# Patient Record
Sex: Female | Born: 1960
Health system: Southern US, Community
[De-identification: ages and names within clinical notes are randomized; demographics above are authoritative.]

## PROBLEM LIST (undated history)

## (undated) DIAGNOSIS — E890 Postprocedural hypothyroidism: Secondary | ICD-10-CM

## (undated) DIAGNOSIS — C801 Malignant (primary) neoplasm, unspecified: Secondary | ICD-10-CM

## (undated) DIAGNOSIS — E0500 Thyrotoxicosis with diffuse goiter without thyrotoxic crisis or storm: Secondary | ICD-10-CM

## (undated) DIAGNOSIS — Z923 Personal history of irradiation: Secondary | ICD-10-CM

## (undated) DIAGNOSIS — F988 Other specified behavioral and emotional disorders with onset usually occurring in childhood and adolescence: Secondary | ICD-10-CM

## (undated) DIAGNOSIS — M8580 Other specified disorders of bone density and structure, unspecified site: Secondary | ICD-10-CM

## (undated) HISTORY — PX: BREAST LUMPECTOMY: SHX2

## (undated) HISTORY — PX: APPENDECTOMY: SHX54

## (undated) HISTORY — PX: OTHER SURGICAL HISTORY: SHX169

## (undated) HISTORY — PX: ORIF TIBIA PLATEAU: SHX2132

## (undated) HISTORY — DX: Other specified behavioral and emotional disorders with onset usually occurring in childhood and adolescence: F98.8

## (undated) HISTORY — DX: Thyrotoxicosis with diffuse goiter without thyrotoxic crisis or storm: E05.00

## (undated) HISTORY — PX: MINOR IRRIGATION AND DEBRIDEMENT OF WOUND: SHX6239

## (undated) HISTORY — DX: Postprocedural hypothyroidism: E89.0

## (undated) HISTORY — PX: VAGINAL HYSTERECTOMY: SUR661

---
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
## 1998-11-03 ENCOUNTER — Other Ambulatory Visit: Admission: RE | Admit: 1998-11-03 | Discharge: 1998-11-03 | Payer: Self-pay | Admitting: *Deleted

## 2001-06-29 ENCOUNTER — Encounter: Admission: RE | Admit: 2001-06-29 | Discharge: 2001-06-29 | Payer: Self-pay | Admitting: Internal Medicine

## 2001-06-29 ENCOUNTER — Encounter: Payer: Self-pay | Admitting: Internal Medicine

## 2002-01-02 ENCOUNTER — Other Ambulatory Visit: Admission: RE | Admit: 2002-01-02 | Discharge: 2002-01-02 | Payer: Self-pay | Admitting: *Deleted

## 2002-02-07 ENCOUNTER — Other Ambulatory Visit: Admission: RE | Admit: 2002-02-07 | Discharge: 2002-02-07 | Payer: Self-pay | Admitting: *Deleted

## 2002-09-04 ENCOUNTER — Other Ambulatory Visit: Admission: RE | Admit: 2002-09-04 | Discharge: 2002-09-04 | Payer: Self-pay | Admitting: *Deleted

## 2003-08-21 ENCOUNTER — Other Ambulatory Visit: Admission: RE | Admit: 2003-08-21 | Discharge: 2003-08-21 | Payer: Self-pay | Admitting: *Deleted

## 2003-10-20 ENCOUNTER — Emergency Department (HOSPITAL_COMMUNITY): Admission: EM | Admit: 2003-10-20 | Discharge: 2003-10-21 | Payer: Self-pay | Admitting: Emergency Medicine

## 2004-08-03 ENCOUNTER — Other Ambulatory Visit: Admission: RE | Admit: 2004-08-03 | Discharge: 2004-08-03 | Payer: Self-pay | Admitting: *Deleted

## 2005-04-21 ENCOUNTER — Encounter: Admission: RE | Admit: 2005-04-21 | Discharge: 2005-04-21 | Payer: Self-pay | Admitting: Internal Medicine

## 2005-06-11 ENCOUNTER — Encounter: Admission: RE | Admit: 2005-06-11 | Discharge: 2005-06-11 | Payer: Self-pay | Admitting: Internal Medicine

## 2005-10-11 ENCOUNTER — Encounter: Payer: Self-pay | Admitting: *Deleted

## 2005-10-14 ENCOUNTER — Encounter (INDEPENDENT_AMBULATORY_CARE_PROVIDER_SITE_OTHER): Payer: Self-pay | Admitting: Specialist

## 2005-10-14 ENCOUNTER — Inpatient Hospital Stay (HOSPITAL_COMMUNITY): Admission: AD | Admit: 2005-10-14 | Discharge: 2005-10-16 | Payer: Self-pay | Admitting: *Deleted

## 2007-03-09 ENCOUNTER — Encounter: Admission: RE | Admit: 2007-03-09 | Discharge: 2007-03-09 | Payer: Self-pay | Admitting: Family Medicine

## 2007-10-19 ENCOUNTER — Encounter: Admission: RE | Admit: 2007-10-19 | Discharge: 2007-10-19 | Payer: Self-pay | Admitting: Family Medicine

## 2009-08-29 ENCOUNTER — Encounter: Admission: RE | Admit: 2009-08-29 | Discharge: 2009-08-29 | Payer: Self-pay | Admitting: Orthopedic Surgery

## 2009-09-16 ENCOUNTER — Encounter: Admission: RE | Admit: 2009-09-16 | Discharge: 2009-09-16 | Payer: Self-pay | Admitting: Orthopedic Surgery

## 2010-05-07 ENCOUNTER — Encounter: Admission: RE | Admit: 2010-05-07 | Discharge: 2010-05-07 | Payer: Self-pay | Admitting: Family Medicine

## 2010-12-27 ENCOUNTER — Encounter: Payer: Self-pay | Admitting: Orthopedic Surgery

## 2011-01-07 ENCOUNTER — Other Ambulatory Visit: Payer: Self-pay | Admitting: Orthopedic Surgery

## 2011-01-07 ENCOUNTER — Ambulatory Visit
Admission: RE | Admit: 2011-01-07 | Discharge: 2011-01-07 | Disposition: A | Discharge status: Home / Self Care | Payer: PRIVATE HEALTH INSURANCE | Source: Ambulatory Visit | Attending: Orthopedic Surgery | Admitting: Orthopedic Surgery

## 2011-03-23 ENCOUNTER — Other Ambulatory Visit: Payer: Self-pay | Admitting: Family Medicine

## 2011-03-23 ENCOUNTER — Other Ambulatory Visit (HOSPITAL_COMMUNITY)
Admission: RE | Admit: 2011-03-23 | Discharge: 2011-03-23 | Disposition: A | Discharge status: Home / Self Care | Payer: PRIVATE HEALTH INSURANCE | Source: Ambulatory Visit | Attending: Family Medicine | Admitting: Family Medicine

## 2011-03-23 DIAGNOSIS — Z124 Encounter for screening for malignant neoplasm of cervix: Secondary | ICD-10-CM | POA: Insufficient documentation

## 2011-04-23 NOTE — H&P (Signed)
[name], [name] NO.:  [id number]   MEDICAL RECORD NO.:  [id number]          PATIENT TYPE:   LOCATION:                                 FACILITY:   PHYSICIAN:  Carla Balls. [name], M.D.        DATE OF BIRTH:   DATE OF ADMISSION:  10/14/2005  DATE OF DISCHARGE:                                HISTORY & PHYSICAL   CHIEF COMPLAINT:  Pain, question endometriosis.   HISTORY:  The patient is a 50 year old gravida 4, para 4 whose last  menstrual period was 01 October 2005. She has been followed in our office  for the past 7 years and has had gradually increasing problem with menses in  terms of pain and heavy flow. She was recently evaluated with abdominal and  pelvic sonogram revealing abnormal uterine size and possible endometrioma on  the left ovary. Because of the persistent pain and abnormal-appearing  sonogram, she is admitted at this time for TAH, possible BSO. She has been  fully counseled as to the nature of the procedure and the risks involved to  include risks of anesthesia, injury to bowel, bladder, blood vessels,  ureters, postoperative hemorrhage, infection, recuperation, use of hormone  replacement should her ovaries be removed. The patient has a history of  elevated lipoprotein A and LDL and is concerned regarding hormone  replacement and the risk of stroke following surgery. She has been counseled  fully on this and will await the surgery to see if this is necessary. She  underwent endometrial biopsy in August 2006 which was totally benign.   PAST MEDICAL HISTORY:  1.  Appendectomy in 1980.  2.  I&D of a pelvic fracture and 1982.  3.  She is hypothyroid and maintained on Synthroid 0.075 mg daily.   FAMILY HISTORY:  Mother with hypertension. Grandfather with an MI.  Grandmother had died of brain tumor. Maternal aunts had breast cancer.   ALLERGIES:  The patient is allergic to no medication.   SOCIAL HISTORY:  The patient is a nonsmoker.   REVIEW  OF SYSTEMS:  HEENT: Negative. CARDIORESPIRATORY: Negative.  GASTROINTESTINAL: Negative. GENITOURINARY: As in present illness.  NEUROMUSCULAR: Negative.   PHYSICAL EXAMINATION:  VITAL SIGNS: Height of 5 feet 7 inches, weight 129  pounds, blood pressure 118/70, pulse 84, respirations 18.  GENERAL: Well-developed white female in no acute distress.  HEENT: Within normal limits.  NECK:  Supple without masses, adenopathy or bruits.  HEART:  Regular rate and rhythm without murmurs.  LUNGS:  Clear to percussion and auscultation.  BREASTS: Examined sitting and lying without mass. Axilla negative.  ABDOMEN: Flat and soft without mass, slightly tender in the both lower  quadrants, left greater than right.  PELVIC:  External genitalia, Bartholin's urethra, and Skene's glands within  normal limits. Vagina is clean. Cervix slightly inflamed. Uterus mid  posterior, top-normal size, shape and contour. Full and tender to the left  with right somewhat tender as well. Anterior posterior cul-de-sac exam is  confirmatory.  EXTREMITIES: Within normal limits.  CENTRAL NERVOUS SYSTEM: Grossly intact.  SKIN:  Without suspicious lesions.  IMPRESSION:  Pelvic pain, persisting left ovarian mass, probable  endometrioma.   DISPOSITION:  As noted above, Pap smear has been normal within the past year  size.           ______________________________  Carla Balls. [name] Patient, M.D.     SRF/MEDQ  D:  10/06/2005  T:  10/06/2005  Job:  [id number]

## 2011-04-23 NOTE — Op Note (Signed)
[name], [name]            ACCOUNT NO.:  [id number]   MEDICAL RECORD NO.:  [id number]          PATIENT TYPE:  AMB   LOCATION:  DAY                          FACILITY:  [facility]   PHYSICIAN:  Almedia Balls. Fore, M.D.   DATE OF BIRTH:  29-Nov-1961   DATE OF PROCEDURE:  10/14/2005  DATE OF DISCHARGE:                                 OPERATIVE REPORT   PREOPERATIVE DIAGNOSES:  1.  Pelvic pain.  2.  Left ovarian cyst.  3.  Abnormal uterine bleeding.  4.  Uterine enlargement.   POSTOPERATIVE DIAGNOSES:  1.  Pelvic pain.  2.  Left ovarian cyst.  3.  Abnormal uterine bleeding.  4.  Uterine enlargement.  5.  Pending pathology.  6.  Frozen section diagnosis of hemorrhagic left ovarian cyst.   OPERATION:  Total abdominal hysterectomy, left salpingo-oophorectomy, right  salpingectomy.   ANESTHESIA:  General orotracheal.   OPERATOR:  Almedia Balls. Randell Patient, M.D.   FIRST ASSISTANT:  Leona Singleton, M.D.   INDICATIONS FOR SURGERY:  The patient is a 50 year old with the above-noted  problems who was counseled as to the need for surgery to treat these  problems. She was fully counseled as to the nature the procedure and the  risks involved including risks of anesthesia, injury to bowel, bladder,  blood vessels, ureters, postoperative hemorrhage, infection, recuperation,  possible hormone replacement should her ovaries be removed. She fully  understands all these considerations and wishes to proceed on October 14, 2005.   OPERATIVE FINDINGS:  On entry into the abdomen, the left ovary was involved  with a cystic mass approximately 8-9 cm in greatest dimension. The cyst was  inadvertently ruptured during the procedure with release of a chocolate  material and old blood. There were adhesions involving the left ovary to the  posterolateral peritoneal surface and the descending rectosigmoid. Right  tube and ovary appeared normal. Uterus was enlarged and soft. Upper  abdominal viscera were normal to  palpation to include liver, gallbladder  areas, spleen, diaphragms, kidneys, periaortic areas.   PROCEDURE:  With the patient under general anesthesia, prepared and draped  in the usual sterile fashion, with a Foley catheter in the bladder, lower  abdominal transverse incision was made and carried into the peritoneal  cavity without difficulty. Self-retaining retractor was placed, and the left  ovary was elevated out of its bed with rupture as noted above. Adhesions  were lysed using Bovie electrocoagulation. The infundibulopelvic ligament on  the left was identified well above the course of the ureter, clamped, cut,  and doubly ligated with 1-0 chromic catgut. The ovarian attachments were  lysed using Bovie electrocoagulation, and Kelly clamp had been placed at the  utero-ovarian anastomosis to the round ligament bilaterally for hemostasis  and traction. The left tube and ovary were then excised and sent for frozen  section with the above-noted diagnosis. The round ligaments were then  transected bilaterally with development of the bladder flap anteriorly using  Bovie electrocoagulation. The bladder was advanced over the lower uterine  segment and cervix without difficulty. The right ovary was normal in  appearance, so a  Heaney clamp was placed proximal to the ovary for  conservation of the structure with the utero-ovarian ligament being cut and  doubly tied with 1-0 chromic catgut. Uterine vessels bilaterally were then  skeletonized, clamped, cut, and suture ligated with 1-0 chromic catgut.  Cardinal ligaments bilaterally were clamped using Heaney clamps, cut, and  suture ligated with 1-0 chromic catgut. It was impossible to excise the  uterus and the majority of the cervix by using Bovie electrocoagulation to  develop the planes and remove the uterus intact. A portion of cervical rim  was left. The cervix and vaginal tissues were reapproximated and rendered  hemostatic with interrupted  figure-of-eight sutures of 1-0 chromic catgut.  The areas was lavaged with copious amounts of lactated Ringer solution, and  after noting hemostasis was maintained in the surgical site, the area was  reperitonealized with a continuous suture of 2-0 PDS. The right tube was  excised using Bovie electrocoagulation to render these the salpinx  hemostatic and excised in this manner. Small bleeders were rendered  hemostatic with Bovie electrocoagulation. Right ovary was elevated out of  the pelvis with an interrupted suture of 2-0 PDS. This was attached to the  right round ligament. At this point with good hemostasis, the correct sponge  and instrument count, the peritoneum was closed with a continuous suture of  0 Vicryl. Fascia was closed with two sutures of 0 Vicryl which were brought  from the lateral aspects of the incision and tied separately in the midline.  Subcutaneous fat was reapproximated with interrupted sutures of 0 Vicryl.  Skin was closed with subcuticular suture of 3-0 plain catgut. Estimated  blood loss 500 mL. The patient was taken to recovery room in good condition  with slightly blood-tinged urine in the Foley catheter tubing. She will be  placed on 23-hour observation following surgery.           ______________________________  [name] Patient, M.D.     SRF/MEDQ  D:  10/14/2005  T:  10/14/2005  Job:  [id number]   cc:   Leona Singleton, M.D.  Fax: [phone]

## 2011-04-23 NOTE — Discharge Summary (Signed)
[name], [name] NO.:  [id number]   MEDICAL RECORD NO.:  [id number]          PATIENT TYPE:  INP   LOCATION:  9302                          FACILITY:  [facility]   PHYSICIAN:  Almedia Balls. Fore, M.D.   DATE OF BIRTH:  [birth year]   DATE OF ADMISSION:  10/14/2005  DATE OF DISCHARGE:  10/16/2005                                 DISCHARGE SUMMARY   HISTORY:  The patient is a 50 year old with pelvic pain, persisting left  ovarian cyst, abnormal uterine bleeding,  uterine enlargement for  hysterectomy on October 14, 2005.  The remainder of her history and physical  are as previously dictated.   LABORATORY DATA:  Preoperative hemoglobin 12.5.  Chest x-ray showed old  granulomatous disease at the apices without acute disease being present.  Electrocardiogram showed possible left atrial enlargement; otherwise normal.   HOSPITAL COURSE:  The patient was taken to the operating room on November 9,  at which time abdominal hysterectomy, left salpingo-oophorectomy, right  salpingectomy were performed for what was found on frozen section to be a  benign hemorrhagic left ovarian cyst and adhesions.  The patient did well  postoperatively except for some postoperative nausea on day #1  postoperatively.  She was kept in the hospital over the evening on November  10 and on the morning of November 11, she was afebrile and experiencing no  problems.  Diet and ambulation had been progressed over the afternoon and  evening of November 10 and the early morning of November 11. It was felt  that she could be discharged on November 11.   FINAL DIAGNOSES:  1.  Pelvic pain.  2.  Large left ovarian cyst.  3.  Abnormal uterine bleeding.  4.  Uterine enlargement.   OPERATION/PROCEDURE:  1.  Abdominal hysteroscopy.  2.  Left salpingo-oophorectomy.  3.  Right salpingectomy.   PATHOLOGY:  Report unavailable at the time of dictation.   DISPOSITION:  Discharged home to return to the office in two  weeks for  followup.   ACTIVITY:  She was instructed to gradually progress with her activities over  several weeks at home and limit lifting and driving for two weeks. She was  fully ambulatory.   DIET:  Regular.   CONDITION ON DISCHARGE:  In good condition at the time of discharge.   DISCHARGE MEDICATIONS:  She has been given prescription for:  1.  Percocet 10/325 mg, generic #30 to be taken one to two q.6h. p.r.n.      pain.  2.  Doxycycline 100 mg #12 to be taken one twice a day.   DISCHARGE INSTRUCTIONS:  She will call for any problems.           ______________________________  [name] Patient, M.D.     SRF/MEDQ  D:  10/16/2005  T:  10/16/2005  Job:  [id number]

## 2014-06-06 ENCOUNTER — Other Ambulatory Visit: Payer: Self-pay | Admitting: Orthopedic Surgery

## 2014-06-06 DIAGNOSIS — S83282A Other tear of lateral meniscus, current injury, left knee, initial encounter: Secondary | ICD-10-CM

## 2014-06-06 DIAGNOSIS — M25562 Pain in left knee: Secondary | ICD-10-CM

## 2014-06-09 ENCOUNTER — Ambulatory Visit
Admission: RE | Admit: 2014-06-09 | Discharge: 2014-06-09 | Disposition: A | Discharge status: Home / Self Care | Payer: PRIVATE HEALTH INSURANCE | Source: Ambulatory Visit | Attending: Orthopedic Surgery | Admitting: Orthopedic Surgery

## 2014-06-09 DIAGNOSIS — M25562 Pain in left knee: Secondary | ICD-10-CM

## 2014-06-09 DIAGNOSIS — S83282A Other tear of lateral meniscus, current injury, left knee, initial encounter: Secondary | ICD-10-CM

## 2014-06-12 ENCOUNTER — Other Ambulatory Visit: Payer: PRIVATE HEALTH INSURANCE

## 2014-06-18 ENCOUNTER — Other Ambulatory Visit: Payer: Self-pay | Admitting: Orthopedic Surgery

## 2014-06-18 DIAGNOSIS — S83282A Other tear of lateral meniscus, current injury, left knee, initial encounter: Secondary | ICD-10-CM

## 2014-06-28 ENCOUNTER — Ambulatory Visit
Admission: RE | Admit: 2014-06-28 | Discharge: 2014-06-28 | Disposition: A | Discharge status: Home / Self Care | Payer: PRIVATE HEALTH INSURANCE | Source: Ambulatory Visit | Attending: Orthopedic Surgery | Admitting: Orthopedic Surgery

## 2014-06-28 DIAGNOSIS — S83282A Other tear of lateral meniscus, current injury, left knee, initial encounter: Secondary | ICD-10-CM

## 2014-06-28 MED ORDER — IOHEXOL 180 MG/ML  SOLN
30.0000 mL | Freq: Once | INTRAMUSCULAR | Status: AC | PRN
Start: 2014-06-28 — End: 2014-06-28
  Administered 2014-06-28: 30 mL via INTRA_ARTICULAR

## 2014-09-13 ENCOUNTER — Other Ambulatory Visit: Payer: Self-pay

## 2014-09-13 DIAGNOSIS — Z1231 Encounter for screening mammogram for malignant neoplasm of breast: Secondary | ICD-10-CM

## 2014-09-26 ENCOUNTER — Encounter (INDEPENDENT_AMBULATORY_CARE_PROVIDER_SITE_OTHER): Payer: Self-pay

## 2014-09-26 ENCOUNTER — Ambulatory Visit
Admission: RE | Admit: 2014-09-26 | Discharge: 2014-09-26 | Disposition: A | Discharge status: Home / Self Care | Payer: PRIVATE HEALTH INSURANCE | Source: Ambulatory Visit

## 2014-09-26 DIAGNOSIS — Z1231 Encounter for screening mammogram for malignant neoplasm of breast: Secondary | ICD-10-CM

## 2014-10-28 ENCOUNTER — Other Ambulatory Visit: Payer: Self-pay | Admitting: Family Medicine

## 2014-10-28 DIAGNOSIS — Z87891 Personal history of nicotine dependence: Secondary | ICD-10-CM

## 2014-11-07 ENCOUNTER — Ambulatory Visit
Admission: RE | Admit: 2014-11-07 | Discharge: 2014-11-07 | Disposition: A | Discharge status: Home / Self Care | Payer: No Typology Code available for payment source | Source: Ambulatory Visit | Attending: Family Medicine | Admitting: Family Medicine

## 2014-11-07 DIAGNOSIS — Z87891 Personal history of nicotine dependence: Secondary | ICD-10-CM

## 2014-11-18 ENCOUNTER — Telehealth: Payer: Self-pay | Admitting: Cardiovascular Disease

## 2014-11-18 NOTE — Telephone Encounter (Signed)
New Message   Pt's husband- Dr. Candise Che [facility]) - wanted pt to be seen by Dr. Johnsie Cancel ASAP. Please call back and discuss.

## 2014-11-18 NOTE — Telephone Encounter (Signed)
Spoke with patient's husband, who reports she had a CT chest done. This showed some coronary artery calcification. He would like her to see Dr. Johnsie Cancel for this. Dr. Lady Deutscher PCP recommended. She would be a new patient. Will forward to .schedulers

## 2014-11-26 ENCOUNTER — Telehealth: Payer: Self-pay | Admitting: *Deleted

## 2014-11-26 NOTE — Telephone Encounter (Signed)
PT'S  HUSBAND  CALLED  AND  WOULD LIKE  WIFE  TO  SEE   DR  Johnsie Cancel RE   CT  RESULTS   WAS  WANTING  TO BE  SEEN  12-02-14 WILL FORWARD  TO  DR Johnsie Cancel  TO FIND OUT WHEN IT WOULD YOU  COULD  SEE PT .Adonis Housekeeper

## 2014-11-28 ENCOUNTER — Other Ambulatory Visit: Payer: Self-pay | Admitting: *Deleted

## 2014-11-28 DIAGNOSIS — I2510 Atherosclerotic heart disease of native coronary artery without angina pectoris: Secondary | ICD-10-CM

## 2014-12-02 NOTE — Telephone Encounter (Signed)
F/u   Pt stated Dr Johnsie Cancel called her and stated she needed an EKG b/f her appt with him on 12/04/14. There wasn't any notes in the system. I advise pt she was sched for an EKG on 12/04/14 and pt wanted me to check with nurse to make sure that appt for EKG was all right. Please advise pt.

## 2014-12-02 NOTE — Telephone Encounter (Signed)
Instructed patient to come to her appointment 12/20 with Dr. Johnsie Cancel at least 30 minutes in advance to have her EKG completed. Patient appreciative and agrees with treatment plan.

## 2014-12-04 ENCOUNTER — Ambulatory Visit (INDEPENDENT_AMBULATORY_CARE_PROVIDER_SITE_OTHER): Payer: PRIVATE HEALTH INSURANCE | Admitting: Cardiovascular Disease

## 2014-12-04 ENCOUNTER — Ambulatory Visit: Payer: PRIVATE HEALTH INSURANCE | Admitting: Cardiovascular Disease

## 2014-12-04 DIAGNOSIS — I2510 Atherosclerotic heart disease of native coronary artery without angina pectoris: Secondary | ICD-10-CM

## 2014-12-04 NOTE — Progress Notes (Signed)
Exercise Treadmill Test  Pre-Exercise Testing Evaluation Rhythm: sinus tachycardia  Rate: 104 bpm     Test  Exercise Tolerance Test Ordering MD: Jenkins Rouge, MD  Interpreting MD: Jenkins Rouge, MD  Unique Test No: 1  Treadmill:  1  Indication for ETT: CAD  Contraindication to ETT: No   Stress Modality: exercise - treadmill  Cardiac Imaging Performed: non   Protocol: standard Bruce - maximal  Max BP:  152/77  Max MPHR (bpm):  165 85% MPR (bpm):  140  MPHR obtained (bpm):  171 % MPHR obtained:  103  Reached 85% MPHR (min:sec):  4:15 Total Exercise Time (min-sec):  8:32  Workload in METS:  14.3 Borg Scale: 18  Reason ETT Terminated:  fatigue    ST Segment Analysis At Rest: normal ST segments - no evidence of significant ST depression With Exercise: no evidence of significant ST depression  Other Information Arrhythmia:  No Angina during ETT:  absent (0) Quality of ETT:  diagnostic  ETT Interpretation:  normal - no evidence of ischemia by ST analysis  Comments: Isolated PVC in recovery no chest pain normal hemodynamic response no ischemia  Recommendations: F/U Primary

## 2016-12-06 DIAGNOSIS — C50919 Malignant neoplasm of unspecified site of unspecified female breast: Secondary | ICD-10-CM

## 2016-12-06 DIAGNOSIS — Z923 Personal history of irradiation: Secondary | ICD-10-CM

## 2016-12-06 HISTORY — DX: Personal history of irradiation: Z92.3

## 2016-12-06 HISTORY — DX: Malignant neoplasm of unspecified site of unspecified female breast: C50.919

## 2017-07-19 ENCOUNTER — Other Ambulatory Visit: Payer: Self-pay | Admitting: Endocrinology

## 2017-07-19 DIAGNOSIS — M810 Age-related osteoporosis without current pathological fracture: Secondary | ICD-10-CM

## 2017-07-19 DIAGNOSIS — Z1231 Encounter for screening mammogram for malignant neoplasm of breast: Secondary | ICD-10-CM

## 2017-08-02 ENCOUNTER — Ambulatory Visit
Admission: RE | Admit: 2017-08-02 | Discharge: 2017-08-02 | Disposition: A | Discharge status: Home / Self Care | Payer: PRIVATE HEALTH INSURANCE | Source: Ambulatory Visit | Attending: Endocrinology | Admitting: Endocrinology

## 2017-08-02 DIAGNOSIS — Z1231 Encounter for screening mammogram for malignant neoplasm of breast: Secondary | ICD-10-CM

## 2017-08-02 DIAGNOSIS — M810 Age-related osteoporosis without current pathological fracture: Secondary | ICD-10-CM

## 2017-08-03 ENCOUNTER — Other Ambulatory Visit: Payer: Self-pay | Admitting: Endocrinology

## 2017-08-03 DIAGNOSIS — R928 Other abnormal and inconclusive findings on diagnostic imaging of breast: Secondary | ICD-10-CM

## 2017-08-04 ENCOUNTER — Ambulatory Visit: Admission: RE | Admit: 2017-08-04 | Payer: PRIVATE HEALTH INSURANCE | Source: Ambulatory Visit

## 2017-08-04 ENCOUNTER — Ambulatory Visit
Admission: RE | Admit: 2017-08-04 | Discharge: 2017-08-04 | Disposition: A | Discharge status: Home / Self Care | Payer: PRIVATE HEALTH INSURANCE | Source: Ambulatory Visit | Attending: Endocrinology | Admitting: Endocrinology

## 2017-08-04 ENCOUNTER — Other Ambulatory Visit: Payer: Self-pay | Admitting: Endocrinology

## 2017-08-04 DIAGNOSIS — R921 Mammographic calcification found on diagnostic imaging of breast: Secondary | ICD-10-CM

## 2017-08-04 DIAGNOSIS — R928 Other abnormal and inconclusive findings on diagnostic imaging of breast: Secondary | ICD-10-CM

## 2017-08-09 ENCOUNTER — Other Ambulatory Visit: Payer: Self-pay | Admitting: *Deleted

## 2017-08-09 ENCOUNTER — Telehealth: Payer: Self-pay | Admitting: Hematology and Oncology

## 2017-08-09 DIAGNOSIS — D0512 Intraductal carcinoma in situ of left breast: Secondary | ICD-10-CM | POA: Insufficient documentation

## 2017-08-09 NOTE — Telephone Encounter (Signed)
LVM for patient to confirm upcoming appointment for 08/10/17, sent email with intake form to [email or web address]  08/09/17

## 2017-08-10 ENCOUNTER — Encounter: Payer: Self-pay | Admitting: Hematology and Oncology

## 2017-08-10 ENCOUNTER — Ambulatory Visit: Payer: PRIVATE HEALTH INSURANCE | Admitting: Physical Therapy

## 2017-08-10 ENCOUNTER — Other Ambulatory Visit: Payer: Self-pay | Admitting: General Surgery

## 2017-08-10 ENCOUNTER — Ambulatory Visit (HOSPITAL_BASED_OUTPATIENT_CLINIC_OR_DEPARTMENT_OTHER): Payer: PRIVATE HEALTH INSURANCE | Admitting: Hematology and Oncology

## 2017-08-10 ENCOUNTER — Inpatient Hospital Stay
Admission: RE | Admit: 2017-08-10 | Discharge: 2017-08-10 | Disposition: A | Discharge status: Home / Self Care | Payer: PRIVATE HEALTH INSURANCE | Source: Ambulatory Visit | Attending: Radiation Oncology | Admitting: Radiation Oncology

## 2017-08-10 ENCOUNTER — Telehealth: Payer: Self-pay

## 2017-08-10 ENCOUNTER — Encounter: Payer: Self-pay | Admitting: General Practice

## 2017-08-10 ENCOUNTER — Other Ambulatory Visit (HOSPITAL_BASED_OUTPATIENT_CLINIC_OR_DEPARTMENT_OTHER): Payer: PRIVATE HEALTH INSURANCE

## 2017-08-10 DIAGNOSIS — D0512 Intraductal carcinoma in situ of left breast: Secondary | ICD-10-CM

## 2017-08-10 DIAGNOSIS — Z170 Estrogen receptor positive status [ER+]: Secondary | ICD-10-CM

## 2017-08-10 LAB — COMPREHENSIVE METABOLIC PANEL
ALT: 14 U/L (ref 0–55)
AST: 21 U/L (ref 5–34)
Albumin: 4.1 g/dL (ref 3.5–5.0)
Alkaline Phosphatase: 43 U/L (ref 40–150)
Anion Gap: 10 mEq/L (ref 3–11)
BUN: 6.4 mg/dL — ABNORMAL LOW (ref 7.0–26.0)
CO2: 25 mEq/L (ref 22–29)
Calcium: 10.2 mg/dL (ref 8.4–10.4)
Chloride: 101 mEq/L (ref 98–109)
Creatinine: 0.7 mg/dL (ref 0.6–1.1)
EGFR: >90 mL/min/{1.73_m2} (ref >90)
Glucose: 87 mg/dl (ref 70–140)
Potassium: 4.3 mEq/L (ref 3.5–5.1)
Sodium: 136 mEq/L (ref 136–145)
Total Bilirubin: 0.43 mg/dL (ref 0.20–1.20)
Total Protein: 7.3 g/dL (ref 6.4–8.3)

## 2017-08-10 LAB — CBC WITH DIFFERENTIAL/PLATELET
BASO%: 1.2 % (ref 0.0–2.0)
Basophils Absolute: 0.1 10*3/uL (ref 0.0–0.1)
EOS%: 0.6 % (ref 0.0–7.0)
Eosinophils Absolute: 0 10*3/uL (ref 0.0–0.5)
HCT: 37 % (ref 34.8–46.6)
HGB: 12.6 g/dL (ref 11.6–15.9)
LYMPH%: 43.2 % (ref 14.0–49.7)
MCH: 32.7 pg (ref 25.1–34.0)
MCHC: 34 g/dL (ref 31.5–36.0)
MCV: 96.4 fL (ref 79.5–101.0)
MONO#: 0.5 10*3/uL (ref 0.1–0.9)
MONO%: 11.3 % (ref 0.0–14.0)
NEUT#: 2.1 10*3/uL (ref 1.5–6.5)
NEUT%: 43.7 % (ref 38.4–76.8)
Platelets: 285 10*3/uL (ref 145–400)
RBC: 3.84 10*6/uL (ref 3.70–5.45)
RDW: 12.7 % (ref 11.2–14.5)
WBC: 4.7 10*3/uL (ref 3.9–10.3)
lymph#: 2 10*3/uL (ref 0.9–3.3)

## 2017-08-10 NOTE — Progress Notes (Signed)
Radiation Oncology         [phone]) [phone] ________________________________  Name: [name]        MRN: [id number]  Date of Service: 08/10/2017 DOB: [birth year]   REFERRING PHYSICIAN: Kelton Pillar, MD   DIAGNOSIS: The encounter diagnosis was Ductal carcinoma in situ (DCIS) of left breast.   HISTORY OF PRESENT ILLNESS: [name] is a 56 y.o. female seen in the multidisciplinary breast clinic. The patient was found to have calcifications at 6:00 in the left breast on screening mammogram. Diagnostic imaging revealed a 1.2 x 1.1 x 1.7 cm mass that was biopsied stereotactically on 08/04/17 which revealed intermediate, ER/PR positive DCIS with calcifications and lobular neoplasia. She comes today to discuss options of treatment for her breast cancer.  PREVIOUS RADIATION THERAPY: Yes   Radioactive Iodine Ablation of the Thyroid in the late 1990s for hyperthyroidism   PAST MEDICAL HISTORY:  Past Medical History:  Diagnosis Date  . ADD (attention deficit disorder)    Adult  . Graves disease   . Postablative hypothyroidism    after radioactive iodine therapy       PAST SURGICAL HISTORY: Past Surgical History:  Procedure Laterality Date  . APPENDECTOMY    . increase lipoprotein    . osteoprosis     s/p fracture L tibial plateau  . VAGINAL HYSTERECTOMY       FAMILY HISTORY:  Family History  Problem Relation Age of Onset  . Breast cancer Maternal Aunt   . Brain cancer Maternal Grandmother      SOCIAL HISTORY:  reports that she has quit smoking. She has never used smokeless tobacco. She reports that she drinks alcohol. She reports that she does not use drugs. The patient is married and lives in [place]. She is a former Chartered loss adjuster. Her husband is a Water engineer.   ALLERGIES: Patient has no known allergies.   MEDICATIONS:  Current Outpatient Prescriptions  Medication Sig Dispense Refill  . Cholecalciferol (VITAMIN D3) 1000 units CAPS Take  1 capsule by mouth daily.    . Ibandronate Sodium (BONIVA PO) Take 1 tablet by mouth every 30 (thirty) days.    Marland Kitchen levothyroxine (SYNTHROID, LEVOTHROID) 75 MCG tablet Take 75 mcg by mouth daily before breakfast.     No current facility-administered medications for this encounter.      REVIEW OF SYSTEMS: On review of systems, the patient reports that she is doing well overall. She denies any chest pain, shortness of breath, cough, fevers, chills, night sweats, unintended weight changes. She denies any bowel or bladder disturbances, and denies abdominal pain, nausea or vomiting. She denies any new musculoskeletal or joint aches or pains. She occasionally notes right ankle edema following an injury about 8 years ago.  A complete review of systems is obtained and is otherwise negative.     PHYSICAL EXAM:  Wt Readings from Last 3 Encounters:  08/10/17 130 lb 3.2 oz (59.1 kg)   Temp Readings from Last 3 Encounters:  08/10/17 98.4 F (36.9 C) (Oral)   BP Readings from Last 3 Encounters:  08/10/17 123/73   Pulse Readings from Last 3 Encounters:  08/10/17 92    In general this is a well appearing caucasian female in no acute distress. She is alert and oriented x4 and appropriate throughout the examination. HEENT reveals that the patient is normocephalic, atraumatic. EOMs are intact. PERRLA. Skin is intact without any evidence of gross lesions. Cardiovascular exam reveals a regular rate and rhythm, no clicks rubs  or murmurs are auscultated. Chest is clear to auscultation bilaterally. Lymphatic assessment is performed and does not reveal any adenopathy in the cervical, supraclavicular, axillary, or inguinal chains. Abdomen has active bowel sounds in all quadrants and is intact. Bilateral breast exam reveals moderate ecchymosis of the left breast inferior to the nipple in the lower outer quadrant. There is induration deep to the biopsy site consistent with hematoma. No other palpable fullness is noted.  No palpable mass is noted of the right breast. Neither breast have any nipple bleeding or discharge noted.  The abdomen is soft, non tender, non distended. Lower extremities reveal R>L trace pretibial pitting edema. No calf tenderness, cyanosis or clubbing is noted.   ECOG = 0  0 - Asymptomatic (Fully active, able to carry on all predisease activities without restriction)  1 - Symptomatic but completely ambulatory (Restricted in physically strenuous activity but ambulatory and able to carry out work of a light or sedentary nature. For example, light housework, office work)  2 - Symptomatic, <50% in bed during the day (Ambulatory and capable of all self care but unable to carry out any work activities. Up and about more than 50% of waking hours)  3 - Symptomatic, >50% in bed, but not bedbound (Capable of only limited self-care, confined to bed or chair 50% or more of waking hours)  4 - Bedbound (Completely disabled. Cannot carry on any self-care. Totally confined to bed or chair)  5 - Death   Eustace Pen MM, Creech RH, Tormey DC, et al. [phone]). "Toxicity and response criteria of the [facility] Group". [place] Oncol. 5 (6): 649-55    LABORATORY DATA:  Lab Results  Component Value Date   WBC 4.7 08/10/2017   HGB 12.6 08/10/2017   HCT 37.0 08/10/2017   MCV 96.4 08/10/2017   PLT 285 08/10/2017   Lab Results  Component Value Date   NA 136 08/10/2017   K 4.3 08/10/2017   CO2 25 08/10/2017   Lab Results  Component Value Date   ALT 14 08/10/2017   AST 21 08/10/2017   ALKPHOS 43 08/10/2017   BILITOT 0.43 08/10/2017      RADIOGRAPHY: Dg Bone Density (dxa)  Result Date: 08/02/2017 EXAM: DUAL X-RAY ABSORPTIOMETRY (DXA) FOR BONE MINERAL DENSITY IMPRESSION: Referring Physician:  Jacelyn Pi PATIENT: Name: [name] A Patient ID: [id number] Birth Date: 11/14/1961 Height: 67.0 in. Sex: Female Measured: 08/02/2017 Weight: 132.7 lbs. Indications: Caucasian,  Estrogen Deficient, Hypothyroid, Hysterectomy, Levothyroxine, Postmenopausal, Right Ovariectomy Fractures: None, Tib/Fib Treatments: Boniva, Vitamin D (E933.5) ASSESSMENT: The BMD measured at Femur Neck Left is 0.724 g/cm2 with a T-score of -2.3. This patient is considered OSTEOPENIC according to [facility]) criteria. L2 was excluded due to being excluded on prior exam. There has been a statistically significant increase in BMD of Lumbar spine, and no statistically significant change in BMD of Left hip since prior exam dated 08/20/2015. Site Region Measured Date Measured Age YA T-score BMD Significant CHANGE DualFemur Neck Left  08/02/2017    58.0         -2.3    0.724 g/cm2 AP Spine L1-L4 (L2) 08/02/2017 56.0 -2.0 0.940 g/cm2 * World Health Organization [facility]) criteria for post-menopausal, Caucasian Women: Normal       T-score at or above -1 SD Osteopenia   T-score between -1 and -2.5 SD Osteoporosis T-score at or below -2.5 SD RECOMMENDATION: [place] recommends that FDA-approved medical therapies be considered in postmenopausal women and men  age 39 or older with a: 1. Hip or vertebral (clinical or morphometric) fracture. 2. T-score of <-2.5 at the spine or hip. 3. Ten-year fracture probability by FRAX of 3% or greater for hip fracture or 20% or greater for major osteoporotic fracture. All treatment decisions require clinical judgment and consideration of individual patient factors, including patient preferences, co-morbidities, previous drug use, risk factors not captured in the FRAX model (e.g. falls, vitamin D deficiency, increased bone turnover, interval significant decline in bone density) and possible under - or over-estimation of fracture risk by FRAX. All patients should ensure an adequate intake of dietary calcium (1200 mg/d) and vitamin D (800 IU daily) unless contraindicated. FOLLOW-UP: People with diagnosed cases of osteoporosis or at high risk for fracture  should have regular bone mineral density tests. For patients eligible for Medicare, routine testing is allowed once every 2 years. The testing frequency can be increased to one year for patients who have rapidly progressing disease, those who are receiving or discontinuing medical therapy to restore bone mass, or have additional risk factors. I have reviewed this report, and agree with the above findings. Mark A. Thornton Papas, M.D. [facility] Electronically Signed   By: Lavonia Dana M.D.   On: 08/02/2017 13:44   Mm Digital Diagnostic Unilat L  Result Date: 08/04/2017 CLINICAL DATA:  Screening. Patient returns after screening study for evaluation of left breast calcifications. EXAM: DIGITAL DIAGNOSTIC UNILATERAL LEFT MAMMOGRAM COMPARISON:  08/02/2017 ACR Breast Density Category c: The breast tissue is heterogeneously dense, which may obscure small masses. FINDINGS: Magnified views are performed of calcifications in the posterior upper inner quadrant of the left breast. These views reveal a loose group of fine pleomorphic calcifications measuring 1.2 x 1.1 x 0.7 cm. Images were processed with CAD. IMPRESSION: Indeterminate left breast calcifications warranting tissue diagnosis. A result letter of this screening mammogram will be mailed directly to the patient. RECOMMENDATION: Stereotactic guided core biopsy is recommended and performed on the same day. BI-RADS CATEGORY  4: Suspicious. Electronically Signed   By: Nolon Nations M.D.   On: 08/04/2017 12:29   Mm Screening Breast Tomo Bilateral  Addendum Date: 08/03/2017   ADDENDUM REPORT: 08/03/2017 10:57 ADDENDUM: Upon reviewing the study, there are new calcifications at 6 o'clock in the left breast which require additional evaluation. IMPRESSION: New calcifications in the left breast at 6 o'clock. RECOMMENDATION: Recommend diagnostic mammography of the new left breast calcifications. BI-RADS category 0-need additional imaging Electronically Signed   By:  Dorise Bullion III M.D   On: 08/03/2017 10:57   Result Date: 08/03/2017 CLINICAL DATA:  Screening. EXAM: 2D DIGITAL SCREENING BILATERAL MAMMOGRAM WITH CAD AND ADJUNCT TOMO COMPARISON:  Previous exam(s). ACR Breast Density Category b: There are scattered areas of fibroglandular density. FINDINGS: There are no findings suspicious for malignancy. Images were processed with CAD. IMPRESSION: No mammographic evidence of malignancy. A result letter of this screening mammogram will be mailed directly to the patient. RECOMMENDATION: Screening mammogram in one year. (Code:SM-B-01Y) BI-RADS CATEGORY  1: Negative. Electronically Signed: By: Curlene Dolphin M.D. On: 08/02/2017 11:51   Mm Clip Placement Left  Result Date: 08/04/2017 CLINICAL DATA:  Status post stereotactic guided core biopsy of left breast calcifications. EXAM: DIAGNOSTIC LEFT MAMMOGRAM POST STEREOTACTIC BIOPSY COMPARISON:  Previous exam(s). FINDINGS: Mammographic images were obtained following stereotactic guided biopsy of calcifications in the posterior upper inner quadrant. An X shaped clip is identified in the posterior upper inner quadrant, associated few residual calcifications. IMPRESSION: Tissue marker clip in the  expected location following biopsy. Final Assessment: Post Procedure Mammograms for Marker Placement Electronically Signed   By: Nolon Nations M.D.   On: 08/04/2017 13:40   Mm Lt Breast Bx W Loc Dev 1st Lesion Image Bx Spec Stereo Guide  Addendum Date: 08/05/2017   ADDENDUM REPORT: 08/05/2017 17:16 ADDENDUM: PATHOLOGY ADDENDUM: Pathology left breast posterior upper inner quadrant: Ductal carcinoma in situ with calcifications and lobular neoplasia (atypical lobular hyperplasia). Pathology concordance with imaging findings: Yes Recommendation: Consultation for treatment plan At the request of the patient, I spoke with her by telephone on 08/05/2017. She reports doing well after the biopsy with minimal tenderness and bruising. Patient is  scheduled for a Multidisciplinary Clinic on Wednesday 08/10/2017 at 12:15 p.m. Electronically Signed   By: Nolon Nations M.D.   On: 08/05/2017 17:16   Result Date: 08/05/2017 CLINICAL DATA:  Patient presents for stereotactic guided core biopsy of left breast calcifications. EXAM: LEFT BREAST STEREOTACTIC CORE NEEDLE BIOPSY COMPARISON:  Previous exams. FINDINGS: The patient and I discussed the procedure of stereotactic-guided biopsy including benefits and alternatives. We discussed the high likelihood of a successful procedure. We discussed the risks of the procedure including infection, bleeding, tissue injury, clip migration, and inadequate sampling. Informed written consent was given. The usual time out protocol was performed immediately prior to the procedure. Using sterile technique and 1% Lidocaine as local anesthetic, under stereotactic guidance, a 9 gauge vacuum assisted device was used to perform core needle biopsy of calcifications in the upper inner quadrant using a lateral approach. Specimen radiograph was performed showing calcifications to be present. Specimens with calcifications are identified for pathology. Lesion quadrant:  Upper inner quadrant At the conclusion of the procedure, an X shaped tissue marker clip was deployed into the biopsy cavity. Follow-up 2-view mammogram was performed and dictated separately. IMPRESSION: Stereotactic-guided biopsy of left breast calcifications. No apparent complications. Electronically Signed: By: Nolon Nations M.D. On: 08/04/2017 13:39       IMPRESSION/PLAN: 1. Intermediate grade, ER/PR positive DCIS with calcifications and lobular neoplasia of the left breast. Dr. Lisbeth Renshaw discusses the pathology findings and reviews the nature of in situ breast disease. The consensus from the breast conference includes options of enrolling in the COMET trial for active surveillance versus treatment with breast conservation with lumpectomy  followed by external  radiotherapy to the breast followed by antiestrogen therapy. We discussed the risks, benefits, short, and long term effects of radiotherapy, and the patient is interested in proceeding. Dr. Lisbeth Renshaw discusses the delivery and logistics of radiotherapy and would anticipate a course of 4 or 6 1/2 weeks of therapy with deep inspriation breath hold. We will see her back about 2 weeks postoperatively if she elects to undergo surgery. She is in agreement with this plan.  The above documentation reflects my direct findings during this shared patient visit. Please see the separate note by Dr. Lisbeth Renshaw on this date for the remainder of the patient's plan of care.    Carola Rhine, PAC

## 2017-08-10 NOTE — Telephone Encounter (Signed)
Error

## 2017-08-10 NOTE — Progress Notes (Signed)
Nutrition Assessment  Reason for Assessment:  Pt seen in Breast Clinic  ASSESSMENT:   56 year old female with new diagnosis of breast cancer.  Past medical history of hypothyroidism s/p radioablation for graves disease.  Patient reports normal appetite.  Medications:  reviewed  Labs: reviewed  Anthropometrics:   Height: 67 inches Weight: 130 lb BMI: 20   NUTRITION DIAGNOSIS: Food and nutrition related knowledge deficit related to new diagnosis of breast cancer as evidenced by no prior need for nutrition related information.  INTERVENTION:   Discussed and provided packet of information regarding nutritional tips for breast cancer patients.  Questions answered.  Teachback method used.  Contact information provided and patient knows to contact me with questions/concerns.    MONITORING, EVALUATION, and GOAL: Pt will consume a healthy plant based diet to maintain lean body mass throughout treatment.   [name] B. Zenia Resides, [place], [place] Registered Dietitian [phone] (pager)

## 2017-08-10 NOTE — Assessment & Plan Note (Signed)
Screening detected left breast calcifications 1.2 x 1.1 x 0.7 cm, stereotactic biopsy intermediate grade DCIS with calcifications, ALH, ER 100% PR 80%, Tis N0 stage 0  Pathology review: I discussed with the patient the difference between DCIS and invasive breast cancer. It is considered a precancerous lesion. DCIS is classified as a 0. It is generally detected through mammograms as calcifications. We discussed the significance of grades and its impact on prognosis. We also discussed the importance of ER and PR receptors and their implications to adjuvant treatment options. Prognosis of DCIS dependence on grade, comedo necrosis. It is anticipated that if not treated, 20-30% of DCIS can develop into invasive breast cancer.  Recommendation: 1. Breast conserving surgery 2. Followed by adjuvant radiation therapy 3. Followed by antiestrogen therapy with tamoxifen 5 years  Tamoxifen counseling: We discussed the risks and benefits of tamoxifen. These include but not limited to insomnia, hot flashes, mood changes, vaginal dryness, and weight gain. Although rare, serious side effects including endometrial cancer, risk of blood clots were also discussed. We strongly believe that the benefits far outweigh the risks. Patient understands these risks and consented to starting treatment. Planned treatment duration is 5 years.  ALTERNATE TREATMENT: AFT 25 COMET Phase 3 clinical trial for low risk DCIS grade 1/2 PR positive, age greater than 40 randomized to surgery +/- radiation, +/- endocrine therapy versus active surveillance with +/- endocrine therapy surveillance with mammograms every 6 months for 5 years;patient's have option to decline elevated arm and still be followed on study

## 2017-08-10 NOTE — Progress Notes (Signed)
[facility] Psychosocial Distress Screening Spiritual Care  Met with [name] and [name] briefly in Breast Multidisciplinary Clinic to introduce [facility] team/resources, reviewing distress screen per protocol.  The patient scored a 1 on the Psychosocial Distress Thermometer which indicates mild distress. Also assessed for distress and other psychosocial needs.   ONCBCN DISTRESS SCREENING 08/10/2017  Screening Type Initial Screening  Distress experienced in past week (1-10) 1  Emotional problem type Adjusting to appearance changes  Referral to support programs Yes   Per couple, very little distress and no needs at this time.   Follow up needed: No. Couple has full packet of [place] and knows to contact Team with any question or concern.   [place], [place], [facility] Pager [phone] Voicemail [phone]

## 2017-08-10 NOTE — Progress Notes (Signed)
[place] NOTE  Patient Care Team: Kelton Pillar, MD as PCP - General (Family Medicine) Rolm Bookbinder, MD as Consulting Physician (General Surgery) Nicholas Lose, MD as Consulting Physician (Hematology and Oncology) Kyung Rudd, MD as Consulting Physician (Radiation Oncology)  CHIEF COMPLAINTS/PURPOSE OF CONSULTATION:  Newly diagnosed left breast DCIS  HISTORY OF PRESENTING ILLNESS:  [name] 56 y.o. female is here because of recent diagnosis of left breast DCIS. Patient had a screening mammogram that detected calcifications measuring 1.2 cm. Stereotactic biopsy identified intermediate grade DCIS with calcifications that is ER/PR positive. She was presented this morning at the multidisciplinary tumor board and she is here today to discuss a treatment plan.  I reviewed her records extensively and collaborated the history with the patient.  SUMMARY OF ONCOLOGIC HISTORY:   Ductal carcinoma in situ (DCIS) of left breast   08/04/2017 Initial Diagnosis    Screening detected left breast calcifications 1.2 x 1.1 x 0.7 cm, stereotactic biopsy intermediate grade DCIS with calcifications, ALH, ER 100% PR 80%, Tis N0 stage 0      MEDICAL HISTORY:  Past Medical History:  Diagnosis Date  . ADD (attention deficit disorder)    Adult  . Graves disease   . Postablative hypothyroidism    after radioactive iodine therapy    SURGICAL HISTORY: Past Surgical History:  Procedure Laterality Date  . increase lipoprotein    . osteoprosis     s/p fracture L tibial plateau    SOCIAL HISTORY: Social History   Social History  . Marital status: Married    Spouse name: N/A  . Number of children: N/A  . Years of education: N/A   Occupational History  . Not on file.   Social History Main Topics  . Smoking status: Former Research scientist (life sciences)  . Smokeless tobacco: Not on file     Comment: quit 1988  . Alcohol use Not on file  . Drug use: Unknown  . Sexual activity:  Not on file   Other Topics Concern  . Not on file   Social History Narrative   Tobacco use cigarettes: Former smoker, Quit in year  1988, Tobacco history  Last updated 09/24/2014 ,no smoking. Alcohol : yes wine. 2+ per week Caffeine : yes 2-3 serving per day.no Recreational drug use . No exercise ,nothing structured. Occupation: unemployed [employer] Status married , Children 4 seat belt use..yes         FAMILY HISTORY: Family History  Problem Relation Age of Onset  . Breast cancer Maternal Aunt     ALLERGIES:  has No Known Allergies.  MEDICATIONS:  Current Outpatient Prescriptions  Medication Sig Dispense Refill  . Cholecalciferol (VITAMIN D3) 1000 units CAPS Take 1 capsule by mouth daily.    . Ibandronate Sodium (BONIVA PO) Take 1 tablet by mouth every 30 (thirty) days.    Marland Kitchen levothyroxine (SYNTHROID, LEVOTHROID) 75 MCG tablet Take 75 mcg by mouth daily before breakfast.     No current facility-administered medications for this visit.     REVIEW OF SYSTEMS:   Constitutional: Denies fevers, chills or abnormal night sweats Eyes: Denies blurriness of vision, double vision or watery eyes Ears, nose, mouth, throat, and face: Denies mucositis or sore throat Respiratory: Denies cough, dyspnea or wheezes Cardiovascular: Denies palpitation, chest discomfort or lower extremity swelling Gastrointestinal:  Denies nausea, heartburn or change in bowel habits Skin: Denies abnormal skin rashes Lymphatics: Denies new lymphadenopathy or easy bruising Neurological:Denies numbness, tingling or new weaknesses  Behavioral/Psych: Mood is stable, no new changes  Breast:  Denies any palpable lumps or discharge All other systems were reviewed with the patient and are negative.  PHYSICAL EXAMINATION: ECOG PERFORMANCE STATUS: 0 - Asymptomatic  Vitals:   08/10/17 1304  BP: 123/73  Pulse: 92  Resp: 20  Temp: 98.4 F (36.9 C)  SpO2: 100%   Filed Weights   08/10/17 1304  Weight:  130 lb 3.2 oz (59.1 kg)    GENERAL:alert, no distress and comfortable SKIN: skin color, texture, turgor are normal, no rashes or significant lesions EYES: normal, conjunctiva are pink and non-injected, sclera clear OROPHARYNX:no exudate, no erythema and lips, buccal mucosa, and tongue normal  NECK: supple, thyroid normal size, non-tender, without nodularity LYMPH:  no palpable lymphadenopathy in the cervical, axillary or inguinal LUNGS: clear to auscultation and percussion with normal breathing effort HEART: regular rate & rhythm and no murmurs and no lower extremity edema ABDOMEN:abdomen soft, non-tender and normal bowel sounds Musculoskeletal:no cyanosis of digits and no clubbing  PSYCH: alert & oriented x 3 with fluent speech NEURO: no focal motor/sensory deficits BREAST: No palpable nodules in breast. No palpable axillary or supraclavicular lymphadenopathy (exam performed in the presence of a chaperone)   LABORATORY DATA:  I have reviewed the data as listed Lab Results  Component Value Date   WBC 4.7 08/10/2017   HGB 12.6 08/10/2017   HCT 37.0 08/10/2017   MCV 96.4 08/10/2017   PLT 285 08/10/2017   Lab Results  Component Value Date   NA 136 08/10/2017   K 4.3 08/10/2017   CO2 25 08/10/2017    RADIOGRAPHIC STUDIES: I have personally reviewed the radiological reports and agreed with the findings in the report.  ASSESSMENT AND PLAN:  Ductal carcinoma in situ (DCIS) of left breast Screening detected left breast calcifications 1.2 x 1.1 x 0.7 cm, stereotactic biopsy intermediate grade DCIS with calcifications, ALH, ER 100% PR 80%, Tis N0 stage 0  Pathology review: I discussed with the patient the difference between DCIS and invasive breast cancer. It is considered a precancerous lesion. DCIS is classified as a 0. It is generally detected through mammograms as calcifications. We discussed the significance of grades and its impact on prognosis. We also discussed the importance  of ER and PR receptors and their implications to adjuvant treatment options. Prognosis of DCIS dependence on grade, comedo necrosis. It is anticipated that if not treated, 20-30% of DCIS can develop into invasive breast cancer.  Recommendation: 1. Breast conserving surgery 2. Followed by adjuvant radiation therapy 3. Followed by antiestrogen therapy with tamoxifen 5 years  Tamoxifen counseling: We discussed the risks and benefits of tamoxifen. These include but not limited to insomnia, hot flashes, mood changes, vaginal dryness, and weight gain. Although rare, serious side effects including endometrial cancer, risk of blood clots were also discussed. We strongly believe that the benefits far outweigh the risks. Patient understands these risks and consented to starting treatment. Planned treatment duration is 5 years.  ALTERNATE TREATMENT: AFT 25 COMET Phase 3 clinical trial for low risk DCIS grade 1/2 PR positive, age greater than 40 randomized to surgery +/- radiation, +/- endocrine therapy versus active surveillance with +/- endocrine therapy surveillance with mammograms every 6 months for 5 years;patient's have option to decline elevated arm and still be followed on study   Patient decided not to go on the trial. RTC after surgery to discuss the final path.   All questions were answered. The patient knows to call the  clinic with any problems, questions or concerns.    Rulon Eisenmenger, MD 08/10/17

## 2017-08-11 ENCOUNTER — Ambulatory Visit (HOSPITAL_BASED_OUTPATIENT_CLINIC_OR_DEPARTMENT_OTHER): Payer: PRIVATE HEALTH INSURANCE | Admitting: Genetics

## 2017-08-11 ENCOUNTER — Other Ambulatory Visit: Payer: Self-pay | Admitting: General Surgery

## 2017-08-11 ENCOUNTER — Encounter: Payer: Self-pay | Admitting: Genetics

## 2017-08-11 ENCOUNTER — Other Ambulatory Visit: Payer: PRIVATE HEALTH INSURANCE

## 2017-08-11 DIAGNOSIS — Z170 Estrogen receptor positive status [ER+]: Secondary | ICD-10-CM | POA: Diagnosis not present

## 2017-08-11 DIAGNOSIS — C50912 Malignant neoplasm of unspecified site of left female breast: Secondary | ICD-10-CM | POA: Diagnosis not present

## 2017-08-11 DIAGNOSIS — Z803 Family history of malignant neoplasm of breast: Secondary | ICD-10-CM

## 2017-08-11 NOTE — Progress Notes (Addendum)
REFERRING PROVIDER: Nicholas Lose, MD [address] [place], [place] [address]  PRIMARY PROVIDER:  Kelton Pillar, MD  PRIMARY REASON FOR VISIT:  1. Malignant neoplasm of left breast in female, estrogen receptor positive, unspecified site of breast ([place])   2. Family history of breast cancer     HISTORY OF PRESENT ILLNESS:   Ms. Morin, a 56 y.o. female, was seen for a [facility] cancer genetics consultation at the request of Dr. Lindi Adie due to a personal and family history of cancer.  Ms. Fugere presents to clinic today to discuss the possibility of a hereditary predisposition to cancer, genetic testing, and to further clarify her future cancer risks, as well as potential cancer risks for family members.   In August 2018, at the age of 18, Ms. Neary was diagnosed with ER/PR+ DCIS of the left breast. Her treatment is pending. She plans lumpectomy, radiation, and tamoxifen x5years. However, she states that the results of her genetic testing may change her plan of care.   CANCER HISTORY:    Ductal carcinoma in situ (DCIS) of left breast   08/04/2017 Initial Diagnosis    Screening detected left breast calcifications 1.2 x 1.1 x 0.7 cm, stereotactic biopsy intermediate grade DCIS with calcifications, ALH, ER 100% PR 80%, Tis N0 stage 0       Past Medical History:  Diagnosis Date  . ADD (attention deficit disorder)    Adult  . Graves disease   . Postablative hypothyroidism    after radioactive iodine therapy    Past Surgical History:  Procedure Laterality Date  . APPENDECTOMY    . increase lipoprotein    . osteoprosis     s/p fracture L tibial plateau  . VAGINAL HYSTERECTOMY      Social History   Social History  . Marital status: Married    Spouse name: N/A  . Number of children: N/A  . Years of education: N/A   Social History Main Topics  . Smoking status: Former Research scientist (life sciences)  . Smokeless tobacco: Never Used     Comment: quit 1988  . Alcohol use 0.0  oz/week  . Drug use: No  . Sexual activity: Not on file   Other Topics Concern  . Not on file   Social History Narrative   Tobacco use cigarettes: Former smoker, Quit in year  1988, Tobacco history  Last updated 09/24/2014 ,no smoking. Alcohol : yes wine. 2+ per week Caffeine : yes 2-3 serving per day.no Recreational drug use . No exercise ,nothing structured. Occupation: unemployed [employer] Status married , Children 4 seat belt use..yes          FAMILY HISTORY:  We obtained a detailed, 4-generation family history.  Significant diagnoses are listed below: Family History  Problem Relation Age of Onset  . Breast cancer Maternal Aunt        d.62 diagnosed in her 38s.  . Dementia Mother   . Stroke Father 19  . Brain cancer Maternal Grandmother 70       d.70s  . Lung cancer Paternal Uncle        d.60s history of smoking  . Heart attack Paternal Grandfather 38       d.72  . Breast cancer Cousin        Daughter of maternal aunt with breast cancer. Diagnosed in her 15s.   Ms. Wurtzel has a son, age 58; a daughter, age 10; and identical twin daughters, age 48. All are without cancers.  Ms. Falkner has two brothers, ages 44 and 82, and a sister, age 26, who are also without cancers.  Ms. Nordmann mother is 75 without cancers. Ms. Coryell only maternal aunt was diagnosed with breast cancer in her 77s and died from the disease at 67. This aunt had a daughter who was also diagnosed with breast cancer in her 61s and is currently in her 66s. Ms. Forand maternal grandmother was diagnosed with and died from brain cancer in her 84s. Ms. Mccauley maternal grandfather died from glomerulonephritis in his 52s.  Ms. Sobecki father died at 69 from a stroke. Ms. Ybanez has three paternal uncles. One uncle died from lung cancer in his 2s. He had a history of smoking. Ms. Kitamura paternal grandfather died from a heart attack at 56. Her paternal grandmother died at  60.  Ms. Katich is unaware of previous family history of genetic testing for hereditary cancer risks. Patient's maternal ancestors are of [place] and [place] descent, and paternal ancestors are of [place] descent. There is no reported Ashkenazi Jewish ancestry. There is no known consanguinity.  GENETIC COUNSELING ASSESSMENT: LIOR CARTELLI is a 56 y.o. female with a personal and family history of breast cancer which is somewhat suggestive of a hereditary cancer syndrome and predisposition to cancer. We, therefore, discussed and recommended the following at today's visit.   DISCUSSION: We reviewed the characteristics, features and inheritance patterns of hereditary cancer syndromes. We also discussed genetic testing, including the appropriate family members to test, the process of testing, insurance coverage and turn-around-time for results. We discussed the implications of a negative, positive and/or variant of uncertain significant result. In order to get genetic test results in a timely manner so that Ms. Senseney can use these genetic test results for surgical decisions, we recommended Ms. Oliveira pursue genetic testing for the 9-gene High/Moderate Breast Risk STAT panel with reflex to the 46-gene Common Hereditary Cancers Panel available through [facility]. [facility]'s Common Hereditary Cancers Panel includes analysis of the following 46 genes: APC, ATM, AXIN2, BARD1, BMPR1A, BRCA1, BRCA2, BRIP1, CDH1, CDKN2A, CHEK2, CTNNA1, DICER1, EPCAM, GREM1, HOXB13, KIT, MEN1, MLH1, MSH2, MSH3, MSH6, MUTYH, NBN, NF1, NTHL1, PALB2, PDGFRA, PMS2, POLD1, POLE, PTEN, RAD50, RAD51C, RAD51D, SDHA, SDHB, SDHC, SDHD, SMAD4, SMARCA4, STK11, TP53, TSC1, TSC2, and VHL.  Based on Ms. Boursiquot's personal and family history of cancer, she meets medical criteria for genetic testing. Despite that she meets criteria, she may still have an out of pocket cost. We discussed that if her out of pocket cost for testing is over $100, the  laboratory will call and confirm whether she wants to proceed with testing.  If the out of pocket cost of testing is less than $100 she will be billed by the genetic testing laboratory.   PLAN: After considering the risks, benefits, and limitations, Ms. Boeh  provided informed consent to pursue genetic testing and the blood sample was sent to [facility] for analysis of the 9-gene High/Moderate Breast Risk STAT panel with reflex to the 46-gene Common Hereditary Cancers Panel. Results for the STAT panel should be available within approximately 2 weeks' time, at which point they will be disclosed by telephone to Ms. Devoto, as will any additional recommendations warranted by these results. This information will also be available in Epic.   Lastly, we encouraged Ms. Spoerl to remain in contact with cancer genetics annually so that we can continuously update the family history and inform her of any changes in cancer genetics and testing that may  be of benefit for this family.   Ms.  Schnepp questions were answered to her satisfaction today. Our contact information was provided should additional questions or concerns arise. Thank you for the referral and allowing [place] to share in the care of your patient.   Mal Misty, MS, [facility] Certified Naval [email or web address] phone: [phone]  The patient was seen for a total of 30 minutes in face-to-face genetic counseling.  This patient was discussed with Drs. Magrinat, Lindi Adie and/or Burr Medico who agrees with the above.    _______________________________________________________________________ For Office Staff:  Number of people involved in session: 1 Was an Intern/ student involved with case: no

## 2017-08-16 ENCOUNTER — Encounter: Payer: Self-pay | Admitting: Radiation Oncology

## 2017-08-16 ENCOUNTER — Other Ambulatory Visit: Payer: Self-pay | Admitting: General Surgery

## 2017-08-16 ENCOUNTER — Telehealth: Payer: Self-pay | Admitting: Hematology and Oncology

## 2017-08-16 ENCOUNTER — Telehealth: Payer: Self-pay | Admitting: *Deleted

## 2017-08-16 DIAGNOSIS — D0512 Intraductal carcinoma in situ of left breast: Secondary | ICD-10-CM

## 2017-08-16 NOTE — Telephone Encounter (Signed)
Spoke with patient and got her scheduled for 10/9.

## 2017-08-16 NOTE — Telephone Encounter (Signed)
Spoke to pt regarding [place] from 08/10/17. Denies questions or concerns regarding dx or treatment care plan. Encourage pt to call with needs. Received verbal understanding.

## 2017-08-22 ENCOUNTER — Telehealth: Payer: Self-pay | Admitting: Genetics

## 2017-08-22 ENCOUNTER — Encounter: Payer: Self-pay | Admitting: Genetics

## 2017-08-22 DIAGNOSIS — Z122 Encounter for screening for malignant neoplasm of respiratory organs: Secondary | ICD-10-CM | POA: Insufficient documentation

## 2017-08-22 DIAGNOSIS — Z1379 Encounter for other screening for genetic and chromosomal anomalies: Secondary | ICD-10-CM | POA: Insufficient documentation

## 2017-08-22 NOTE — Telephone Encounter (Signed)
Revealed negative results for the 9 high risk breast cancer genes.  Verified that she wanted to proceed with reflex testing to the 46 panel patient confirmed she wanted reflex testing.  I explained that these 9 high risk breast cancer genes are the genes for which there may be surgical recommendations and that would potentially influence her breast surgical decision.       I will call to let her know when the results of the full gene panel are available.

## 2017-08-24 ENCOUNTER — Ambulatory Visit: Payer: Self-pay | Admitting: Genetics

## 2017-08-24 ENCOUNTER — Telehealth: Payer: Self-pay | Admitting: Genetics

## 2017-08-24 ENCOUNTER — Encounter: Payer: Self-pay | Admitting: Genetics

## 2017-08-24 DIAGNOSIS — Z803 Family history of malignant neoplasm of breast: Secondary | ICD-10-CM

## 2017-08-24 DIAGNOSIS — Z170 Estrogen receptor positive status [ER+]: Secondary | ICD-10-CM

## 2017-08-24 DIAGNOSIS — D0512 Intraductal carcinoma in situ of left breast: Secondary | ICD-10-CM

## 2017-08-24 DIAGNOSIS — C50912 Malignant neoplasm of unspecified site of left female breast: Secondary | ICD-10-CM

## 2017-08-24 DIAGNOSIS — Z1379 Encounter for other screening for genetic and chromosomal anomalies: Secondary | ICD-10-CM

## 2017-08-24 NOTE — Telephone Encounter (Signed)
Revealed negative genetic testing.  These results are reassuring, however we discussed that we do not know why she has breast cancer or why there is cancer in the family. It could be due to a different gene that we are not testing, or maybe our current technology may not be able to pick something up.  It will be important for her to keep in contact with genetics to keep up with whether additional testing may be needed.

## 2017-08-24 NOTE — Pre-Procedure Instructions (Signed)
[name]  08/24/2017      [facility] - [place], [place] AT [facility] OF [address] [place] [place] [address] Phone: [phone] Fax: [phone]    Your procedure is scheduled on  Tuesday, Sept 25th   Report to [facility] Admitting at 12:30 pm.             (posted surgery time 2:30p-3:30p)   Call this number if you have problems the MORNING of surgery:  [phone]   Remember:   Do not eat food or drink liquids after midnight Monday.               4-5 days prior to surgery, STOP TAKING any Vitamins, Herbal Supplements, Anti-inflammatories.   Take these medicines the morning of surgery with A SIP OF WATER : Levothyroxine   Do not wear jewelry, make-up or nail polish.  Do not wear lotions, powders,  perfumes, or deoderant.  Do not shave 48 hours prior to surgery.    Do not bring valuables to the hospital.  [facility] is not responsible for any belongings or valuables.  Contacts, dentures or bridgework may not be worn into surgery.  Leave your suitcase in the car.  After surgery it may be brought to your room.  For patients admitted to the hospital, discharge time will be determined by your treatment team.  Patients discharged the day of surgery will not be allowed to drive home, and will need to have someone stay with you for the next 24 hrs.  Please read over the following fact sheets that you were given. Pain Booklet and Surgical Site Infection Prevention       [facility]- Preparing For Surgery  Before surgery, you can play an important role. Because skin is not sterile, your skin needs to be as free of germs as possible. You can reduce the number of germs on your skin by washing with CHG (chlorahexidine gluconate) Soap before surgery.  CHG is an antiseptic cleaner which kills germs and bonds with the skin to continue killing germs even after washing.  Please do not use if you  have an allergy to CHG or antibacterial soaps. If your skin becomes reddened/irritated stop using the CHG.  Do not shave (including legs and underarms) for at least 48 hours prior to first CHG shower. It is OK to shave your face.  Please follow these instructions carefully.   1. Shower the NIGHT BEFORE SURGERY and the MORNING OF SURGERY with CHG.   2. If you chose to wash your hair, wash your hair first as usual with your normal shampoo.  3. After you shampoo, rinse your hair and body thoroughly to remove the shampoo.  4. Use CHG as you would any other liquid soap. You can apply CHG directly to the skin and wash gently with a scrungie or a clean washcloth.   5. Apply the CHG Soap to your body ONLY FROM THE NECK DOWN.  Do not use on open wounds or open sores. Avoid contact with your eyes, ears, mouth and genitals (private parts). Wash genitals (private parts) with your normal soap.  6. Wash thoroughly, paying special attention to the area where your surgery will be performed.  7. Thoroughly rinse your body with warm water from the neck down.  8. DO NOT shower/wash with your normal soap after using and rinsing off the CHG Soap.  9. Pat yourself dry  with a CLEAN TOWEL.   10. Wear CLEAN PAJAMAS   11. Place CLEAN SHEETS on your bed the night of your first shower and DO NOT SLEEP WITH PETS.    Day of Surgery: Do not apply any deodorants/lotions. Please wear clean clothes to the hospital/surgery center.

## 2017-08-24 NOTE — Progress Notes (Signed)
HPI: Ms. Manansala was previously seen in the [facility] on 08/11/2017 due to a personal and family history of breast cancer and concerns regarding a hereditary predisposition to cancer. Please refer to our prior cancer genetics clinic note for more information regarding Ms. Vanwagoner's medical, social and family histories, and our assessment and recommendations, at the time. Ms. Bartoszek's recent genetic test results were disclosed to her, as well as recommendations warranted by these results. These results and recommendations are discussed in more detail below.  CANCER HISTORY:    Ductal carcinoma in situ (DCIS) of left breast   08/04/2017 Initial Diagnosis    Screening detected left breast calcifications 1.2 x 1.1 x 0.7 cm, stereotactic biopsy intermediate grade DCIS with calcifications, ALH, ER 100% PR 80%, Tis N0 stage 0      08/22/2017 Genetic Testing    The patient had genetic testing due to a personal and family history of breast cancer.  The Common Hereditary Cancer Panel was ordered. The Hereditary Gene Panel offered by [facility] includes sequencing and/or deletion duplication testing of the following 46 genes: APC, ATM, AXIN2, BARD1, BMPR1A, BRCA1, BRCA2, BRIP1, CDH1, CDKN2A (p14ARF), CDKN2A (p16INK4a), CHEK2, CTNNA1, DICER1, EPCAM (Deletion/duplication testing only), GREM1 (promoter region deletion/duplication testing only), KIT, MEN1, MLH1, MSH2, MSH3, MSH6, MUTYH, NBN, NF1, NHTL1, PALB2, PDGFRA, PMS2, POLD1, POLE, PTEN, RAD50, RAD51C, RAD51D, SDHB, SDHC, SDHD, SMAD4, SMARCA4. STK11, TP53, TSC1, TSC2, and VHL.  The following genes were evaluated for sequence changes only: SDHA and HOXB13 c.251G>A variant only.  Results: Negative, No pathogenic mutations identified in the 46 genes analyzed.  The date of this test report is 08/22/2017.        FAMILY HISTORY:  We obtained a detailed, 4-generation family history.  Significant diagnoses are listed below: Family History    Problem Relation Age of Onset  . Breast cancer Maternal Aunt        d.62 diagnosed in her 40s.  . Dementia Mother   . Stroke Father 82  . Brain cancer Maternal Grandmother 70       d.70s  . Lung cancer Paternal Uncle        d.60s history of smoking  . Heart attack Paternal Grandfather 72       d.72  . Breast cancer Cousin        Daughter of maternal aunt with breast cancer. Diagnosed in her 40s.   Ms. Safer has a son, age 26; a daughter, age 29; and identical twin daughters, age 23. All are without cancers. Ms. Vitolo has two brothers, ages 60 and 55, and a sister, age 59, who are also without cancers.  Ms. Hermans's mother is 85 without cancers. Ms. Brisk's only maternal aunt was diagnosed with breast cancer in her 40s and died from the disease at 62. This aunt had a daughter who was also diagnosed with breast cancer in her 40s and is currently in her 50s. Ms. Barth's maternal grandmother was diagnosed with and died from brain cancer in her 70s. Ms. Mcniel's maternal grandfather died from glomerulonephritis in his 40s.  Ms. Governale's father died at 82 from a stroke. Ms. Merta has three paternal uncles. One uncle died from lung cancer in his 60s. He had a history of smoking. Ms. Seelinger's paternal grandfather died from a heart attack at 72. Her paternal grandmother died at 97.  Ms. Medici is unaware of previous family history of genetic testing for hereditary cancer risks. Patient's maternal ancestors are of Czech and Dutch   descent, and paternal ancestors are of [place] descent. There is no reported Ashkenazi Jewish ancestry. There is no known consanguinity.  GENETIC TEST RESULTS: Genetic testing performed through [facility]'s Common Hereditary Cancers Panel reported out on 08/22/2017 showed no pathogenic mutations. The Hereditary Gene Panel offered by [facility] includes sequencing and/or deletion duplication testing of the following 46 genes: APC, ATM, AXIN2, BARD1,  BMPR1A, BRCA1, BRCA2, BRIP1, CDH1, CDKN2A (p14ARF), CDKN2A (p16INK4a), CHEK2, CTNNA1, DICER1, EPCAM (Deletion/duplication testing only), GREM1 (promoter region deletion/duplication testing only), KIT, MEN1, MLH1, MSH2, MSH3, MSH6, MUTYH, NBN, NF1, NHTL1, PALB2, PDGFRA, PMS2, POLD1, POLE, PTEN, RAD50, RAD51C, RAD51D, SDHB, SDHC, SDHD, SMAD4, SMARCA4. STK11, TP53, TSC1, TSC2, and VHL.  The following genes were evaluated for sequence changes only: SDHA and HOXB13 c.251G>A variant only..  The test report will be scanned into EPIC and will be located under the Molecular Pathology section of the Results Review tab.A portion of the result report is included below for reference.      We discussed with Ms. Sterbenz that because current genetic testing is not perfect, it is possible there may be a gene mutation in one of these genes that current testing cannot detect, but that chance is small. We also discussed, that there could be another gene that has not yet been discovered, or that we have not yet tested, that is responsible for the cancer diagnoses in the family. Therefore, it is important to remain in touch with cancer genetics in the future so that we can continue to offer Ms. Hessling the most up to date genetic testing.   ADDITIONAL GENETIC TESTING: We discussed with Ms. Fenderson that there are other genes that are associated with increased cancer risk that can be analyzed. The laboratories that offer this testing look at these additional genes via a hereditary cancer gene panel. Should Ms. Noto wish to pursue additional genetic testing, we are happy to discuss and coordinate this testing, at any time.    CANCER SCREENING RECOMMENDATIONS: This result is reassuring and indicates that it is unlikely Ms. Sonoda has an increased risk for a future cancer due to a mutation in one of these genes. This normal test also suggests that Ms. Siguenza's cancer was most likely not due to an inherited  predisposition associated with one of these genes.  Most cancers happen by chance and this negative test suggests that her cancer may fall into this category.  Therefore, it is recommended she continue to follow the cancer management and screening guidelines provided by her oncology and primary healthcare provider. Other factors such as her personal and family history may still affect her cancer risk.  RECOMMENDATIONS FOR FAMILY MEMBERS: Women in this family might be at some increased risk of developing cancer, over the general population risk, simply due to the family history of cancer. We recommended women in this family have a yearly mammogram beginning at age 15, or 17 years younger than the earliest onset of cancer, an annual clinical breast exam, and perform monthly breast self-exams. Her daughters should inform their physicians of the family history of cancer so that they can assess their individual risks and make appropriate cancer screening recommendations. Women in this family should also have a gynecological exam as recommended by their primary provider. All family members should have a colonoscopy by age 82.  Based on Ms. Tigue's family history, we recommended her cousin, who was diagnosed with breast cancer in her 72's, have genetic counseling and testing. Ms. Niday will let us know if we  can be of any assistance in coordinating genetic counseling and/or testing for this family member.   FOLLOW-UP: Lastly, we discussed with Ms. Dixson that cancer genetics is a rapidly advancing field and it is possible that new genetic tests will be appropriate for her and/or her family members in the future. We encouraged her to remain in contact with cancer genetics on an annual basis so we can update her personal and family histories and let her know of advances in cancer genetics that may benefit this family.   Our contact number was provided. Ms. Kievit questions were answered to her  satisfaction, and she knows she is welcome to call us at anytime with additional questions or concerns.   Ferol Luz, MS Genetic Counselor [email or web address]

## 2017-08-25 ENCOUNTER — Encounter (HOSPITAL_COMMUNITY)
Admission: RE | Admit: 2017-08-25 | Discharge: 2017-08-25 | Disposition: A | Discharge status: Home / Self Care | Payer: PRIVATE HEALTH INSURANCE | Source: Ambulatory Visit | Attending: General Surgery | Admitting: General Surgery

## 2017-08-25 ENCOUNTER — Encounter (HOSPITAL_COMMUNITY): Payer: Self-pay

## 2017-08-25 DIAGNOSIS — Z01812 Encounter for preprocedural laboratory examination: Secondary | ICD-10-CM | POA: Insufficient documentation

## 2017-08-25 DIAGNOSIS — D0512 Intraductal carcinoma in situ of left breast: Secondary | ICD-10-CM | POA: Diagnosis not present

## 2017-08-25 HISTORY — DX: Other specified disorders of bone density and structure, unspecified site: M85.80

## 2017-08-25 HISTORY — DX: Malignant (primary) neoplasm, unspecified: C80.1

## 2017-08-25 LAB — CBC
HCT: 36.9 % (ref 36.0–46.0)
Hemoglobin: 12.4 g/dL (ref 12.0–15.0)
MCH: 32.2 pg (ref 26.0–34.0)
MCHC: 33.6 g/dL (ref 30.0–36.0)
MCV: 95.8 fL (ref 78.0–100.0)
Platelets: 299 10*3/uL (ref 150–400)
RBC: 3.85 MIL/uL — ABNORMAL LOW (ref 3.87–5.11)
RDW: 12.9 % (ref 11.5–15.5)
WBC: 5 10*3/uL (ref 4.0–10.5)

## 2017-08-25 LAB — BASIC METABOLIC PANEL
Anion gap: 10 (ref 5–15)
BUN: <5 mg/dL — ABNORMAL LOW (ref 6–20)
CO2: 24 mmol/L (ref 22–32)
Calcium: 10.1 mg/dL (ref 8.9–10.3)
Chloride: 102 mmol/L (ref 101–111)
Creatinine, Ser: 0.58 mg/dL (ref 0.44–1.00)
GFR calc Af Amer: >60 mL/min (ref >60)
GFR calc non Af Amer: >60 mL/min (ref >60)
Glucose, Bld: 88 mg/dL (ref 65–99)
Potassium: 4.4 mmol/L (ref 3.5–5.1)
Sodium: 136 mmol/L (ref 135–145)

## 2017-08-25 LAB — PROTIME-INR
INR: 0.98
Prothrombin Time: 12.9 seconds (ref 11.4–15.2)

## 2017-08-25 LAB — APTT: aPTT: 36 seconds (ref 24–36)

## 2017-08-25 NOTE — Progress Notes (Signed)
PCP is Dr. Kelton Pillar   [facility] Oncologist is Dr Jennette Kettle Radiation Oncologist is Dr. Paris Lore Saw Had treadmill (Dr. Edmonia James) back in 11/2014

## 2017-08-29 ENCOUNTER — Ambulatory Visit
Admission: RE | Admit: 2017-08-29 | Discharge: 2017-08-29 | Disposition: A | Discharge status: Home / Self Care | Payer: PRIVATE HEALTH INSURANCE | Source: Ambulatory Visit | Attending: General Surgery | Admitting: General Surgery

## 2017-08-29 DIAGNOSIS — D0512 Intraductal carcinoma in situ of left breast: Secondary | ICD-10-CM

## 2017-08-29 MED ORDER — ACETAMINOPHEN 500 MG PO TABS
1000.0000 mg | ORAL_TABLET | ORAL | Status: AC
  Administered 2017-08-30: 1000 mg via ORAL
  Filled 2017-08-29: qty 2

## 2017-08-29 MED ORDER — GABAPENTIN 300 MG PO CAPS
300.0000 mg | ORAL_CAPSULE | ORAL | Status: AC
  Administered 2017-08-30: 300 mg via ORAL
  Filled 2017-08-29: qty 1

## 2017-08-29 MED ORDER — CEFAZOLIN SODIUM-DEXTROSE 2-4 GM/100ML-% IV SOLN
2.0000 g | INTRAVENOUS | Status: AC
  Administered 2017-08-30: 2 g via INTRAVENOUS
  Filled 2017-08-29: qty 100

## 2017-08-29 MED ORDER — CELECOXIB 200 MG PO CAPS
400.0000 mg | ORAL_CAPSULE | ORAL | Status: AC
  Administered 2017-08-30: 400 mg via ORAL
  Filled 2017-08-29: qty 2

## 2017-08-29 NOTE — Progress Notes (Signed)
Pt previously made aware to arrive at 10:30AM

## 2017-08-30 ENCOUNTER — Encounter (HOSPITAL_COMMUNITY)
Admission: RE | Disposition: A | Discharge status: Home / Self Care | Payer: Self-pay | Source: Ambulatory Visit | Attending: General Surgery

## 2017-08-30 ENCOUNTER — Ambulatory Visit (HOSPITAL_COMMUNITY): Payer: PRIVATE HEALTH INSURANCE | Admitting: Certified Registered Nurse Anesthetist

## 2017-08-30 ENCOUNTER — Ambulatory Visit (HOSPITAL_COMMUNITY): Payer: PRIVATE HEALTH INSURANCE | Admitting: Emergency Medicine

## 2017-08-30 ENCOUNTER — Ambulatory Visit (HOSPITAL_COMMUNITY)
Admission: RE | Admit: 2017-08-30 | Discharge: 2017-08-30 | Disposition: A | Discharge status: Home / Self Care | Payer: PRIVATE HEALTH INSURANCE | Source: Ambulatory Visit | Attending: General Surgery | Admitting: General Surgery

## 2017-08-30 ENCOUNTER — Ambulatory Visit
Admission: RE | Admit: 2017-08-30 | Discharge: 2017-08-30 | Disposition: A | Discharge status: Home / Self Care | Payer: PRIVATE HEALTH INSURANCE | Source: Ambulatory Visit | Attending: General Surgery | Admitting: General Surgery

## 2017-08-30 ENCOUNTER — Encounter (HOSPITAL_COMMUNITY): Payer: Self-pay | Admitting: Certified Registered Nurse Anesthetist

## 2017-08-30 DIAGNOSIS — E039 Hypothyroidism, unspecified: Secondary | ICD-10-CM | POA: Diagnosis not present

## 2017-08-30 DIAGNOSIS — D0512 Intraductal carcinoma in situ of left breast: Secondary | ICD-10-CM | POA: Insufficient documentation

## 2017-08-30 DIAGNOSIS — Z87891 Personal history of nicotine dependence: Secondary | ICD-10-CM | POA: Diagnosis not present

## 2017-08-30 HISTORY — PX: BREAST LUMPECTOMY: SHX2

## 2017-08-30 HISTORY — PX: BREAST LUMPECTOMY WITH RADIOACTIVE SEED LOCALIZATION: SHX6424

## 2017-08-30 SURGERY — BREAST LUMPECTOMY WITH RADIOACTIVE SEED LOCALIZATION
Anesthesia: General | Site: Breast | Laterality: Left

## 2017-08-30 MED ORDER — DEXAMETHASONE SODIUM PHOSPHATE 10 MG/ML IJ SOLN
INTRAMUSCULAR | Status: DC | PRN
  Administered 2017-08-30: 10 mg via INTRAVENOUS

## 2017-08-30 MED ORDER — SODIUM CHLORIDE 0.9% FLUSH: 3.0000 mL | Freq: Two times a day (BID) | INTRAVENOUS | Status: DC

## 2017-08-30 MED ORDER — OXYCODONE HCL 5 MG PO TABS: 5.0000 mg | ORAL_TABLET | ORAL | Status: DC | PRN

## 2017-08-30 MED ORDER — KETOROLAC TROMETHAMINE 15 MG/ML IJ SOLN: 15.0000 mg | Freq: Four times a day (QID) | INTRAMUSCULAR | Status: DC

## 2017-08-30 MED ORDER — ACETAMINOPHEN 650 MG RE SUPP: 650.0000 mg | RECTAL | Status: DC | PRN

## 2017-08-30 MED ORDER — SODIUM CHLORIDE 0.9 % IV SOLN: 250.0000 mL | INTRAVENOUS | Status: DC | PRN

## 2017-08-30 MED ORDER — HEMOSTATIC AGENTS (NO CHARGE) OPTIME
TOPICAL | Status: DC | PRN
  Administered 2017-08-30: 1 via TOPICAL

## 2017-08-30 MED ORDER — OXYCODONE-ACETAMINOPHEN 10-325 MG PO TABS: 1.0000 | ORAL_TABLET | Freq: Four times a day (QID) | ORAL | 0 refills | Status: DC | PRN

## 2017-08-30 MED ORDER — ONDANSETRON HCL 4 MG/2ML IJ SOLN
INTRAMUSCULAR | Status: DC | PRN
  Administered 2017-08-30: 4 mg via INTRAVENOUS

## 2017-08-30 MED ORDER — ACETAMINOPHEN 325 MG PO TABS: 650.0000 mg | ORAL_TABLET | ORAL | Status: DC | PRN

## 2017-08-30 MED ORDER — FENTANYL CITRATE (PF) 250 MCG/5ML IJ SOLN
INTRAMUSCULAR | Status: AC
  Filled 2017-08-30: qty 5

## 2017-08-30 MED ORDER — MIDAZOLAM HCL 5 MG/5ML IJ SOLN
INTRAMUSCULAR | Status: DC | PRN
  Administered 2017-08-30: 2 mg via INTRAVENOUS

## 2017-08-30 MED ORDER — BUPIVACAINE-EPINEPHRINE 0.25% -1:200000 IJ SOLN
INTRAMUSCULAR | Status: DC | PRN
  Administered 2017-08-30: 10 mL

## 2017-08-30 MED ORDER — PHENYLEPHRINE HCL 10 MG/ML IJ SOLN
INTRAMUSCULAR | Status: DC | PRN
  Administered 2017-08-30 (×2): 80 ug via INTRAVENOUS

## 2017-08-30 MED ORDER — FENTANYL CITRATE (PF) 100 MCG/2ML IJ SOLN
INTRAMUSCULAR | Status: DC | PRN
  Administered 2017-08-30: 25 ug via INTRAVENOUS
  Administered 2017-08-30: 50 ug via INTRAVENOUS
  Administered 2017-08-30: 25 ug via INTRAVENOUS

## 2017-08-30 MED ORDER — PROPOFOL 10 MG/ML IV BOLUS
INTRAVENOUS | Status: AC
  Filled 2017-08-30: qty 20

## 2017-08-30 MED ORDER — SODIUM CHLORIDE 0.9% FLUSH: 3.0000 mL | INTRAVENOUS | Status: DC | PRN

## 2017-08-30 MED ORDER — DEXAMETHASONE SODIUM PHOSPHATE 10 MG/ML IJ SOLN
INTRAMUSCULAR | Status: AC
  Filled 2017-08-30: qty 1

## 2017-08-30 MED ORDER — LACTATED RINGERS IV SOLN
INTRAVENOUS | Status: DC
  Administered 2017-08-30 (×3): via INTRAVENOUS

## 2017-08-30 MED ORDER — 0.9 % SODIUM CHLORIDE (POUR BTL) OPTIME
TOPICAL | Status: DC | PRN
  Administered 2017-08-30: 1000 mL

## 2017-08-30 MED ORDER — HYDROMORPHONE HCL 1 MG/ML IJ SOLN: 0.2500 mg | INTRAMUSCULAR | Status: DC | PRN

## 2017-08-30 MED ORDER — MIDAZOLAM HCL 2 MG/2ML IJ SOLN
INTRAMUSCULAR | Status: AC
  Filled 2017-08-30: qty 2

## 2017-08-30 MED ORDER — LIDOCAINE 2% (20 MG/ML) 5 ML SYRINGE
INTRAMUSCULAR | Status: DC | PRN
  Administered 2017-08-30: 60 mg via INTRAVENOUS

## 2017-08-30 MED ORDER — PHENYLEPHRINE 40 MCG/ML (10ML) SYRINGE FOR IV PUSH (FOR BLOOD PRESSURE SUPPORT)
PREFILLED_SYRINGE | INTRAVENOUS | Status: AC
  Filled 2017-08-30: qty 10

## 2017-08-30 MED ORDER — PROPOFOL 10 MG/ML IV BOLUS
INTRAVENOUS | Status: DC | PRN
  Administered 2017-08-30: 160 mg via INTRAVENOUS

## 2017-08-30 MED ORDER — SODIUM CHLORIDE 0.9 % IV SOLN: INTRAVENOUS | Status: DC

## 2017-08-30 MED ORDER — LIDOCAINE 2% (20 MG/ML) 5 ML SYRINGE
INTRAMUSCULAR | Status: AC
  Filled 2017-08-30: qty 5

## 2017-08-30 MED ORDER — ONDANSETRON HCL 4 MG/2ML IJ SOLN
INTRAMUSCULAR | Status: AC
  Filled 2017-08-30: qty 2

## 2017-08-30 MED ORDER — MORPHINE SULFATE (PF) 2 MG/ML IV SOLN: 2.0000 mg | INTRAVENOUS | Status: DC | PRN

## 2017-08-30 SURGICAL SUPPLY — 48 items
ADH SKN CLS APL DERMABOND .7 (GAUZE/BANDAGES/DRESSINGS) ×1
APPLIER CLIP 9.375 MED OPEN (MISCELLANEOUS)
APR CLP MED 9.3 20 MLT OPN (MISCELLANEOUS)
BINDER BREAST LRG (GAUZE/BANDAGES/DRESSINGS) ×1 IMPLANT
BINDER BREAST XLRG (GAUZE/BANDAGES/DRESSINGS) IMPLANT
BLADE SURG 15 STRL LF DISP TIS (BLADE) ×1 IMPLANT
BLADE SURG 15 STRL SS (BLADE) ×2
CANISTER SUCT 3000ML PPV (MISCELLANEOUS) ×2 IMPLANT
CHLORAPREP W/TINT 26ML (MISCELLANEOUS) ×2 IMPLANT
CLIP APPLIE 9.375 MED OPEN (MISCELLANEOUS) IMPLANT
COVER PROBE W GEL 5X96 (DRAPES) ×2 IMPLANT
COVER SURGICAL LIGHT HANDLE (MISCELLANEOUS) ×2 IMPLANT
DERMABOND ADVANCED (GAUZE/BANDAGES/DRESSINGS) ×1
DERMABOND ADVANCED .7 DNX12 (GAUZE/BANDAGES/DRESSINGS) ×1 IMPLANT
DEVICE DUBIN SPECIMEN MAMMOGRA (MISCELLANEOUS) ×2 IMPLANT
DRAPE CHEST BREAST 15X10 FENES (DRAPES) ×2 IMPLANT
DRAPE UTILITY XL STRL (DRAPES) ×2 IMPLANT
ELECT COATED BLADE 2.86 ST (ELECTRODE) ×2 IMPLANT
ELECT REM PT RETURN 9FT ADLT (ELECTROSURGICAL) ×2
ELECTRODE REM PT RTRN 9FT ADLT (ELECTROSURGICAL) ×1 IMPLANT
GLOVE BIO SURGEON STRL SZ7 (GLOVE) ×4 IMPLANT
GLOVE BIOGEL PI IND STRL 7.5 (GLOVE) ×1 IMPLANT
GLOVE BIOGEL PI INDICATOR 7.5 (GLOVE) ×1
GOWN STRL REUS W/ TWL LRG LVL3 (GOWN DISPOSABLE) ×2 IMPLANT
GOWN STRL REUS W/TWL LRG LVL3 (GOWN DISPOSABLE) ×4
ILLUMINATOR WAVEGUIDE N/F (MISCELLANEOUS) ×2 IMPLANT
KIT BASIN OR (CUSTOM PROCEDURE TRAY) ×2 IMPLANT
KIT MARKER MARGIN INK (KITS) ×2 IMPLANT
NDL HYPO 25GX1X1/2 BEV (NEEDLE) ×1 IMPLANT
NEEDLE HYPO 25GX1X1/2 BEV (NEEDLE) ×2 IMPLANT
NS IRRIG 1000ML POUR BTL (IV SOLUTION) ×2 IMPLANT
PACK SURGICAL SETUP 50X90 (CUSTOM PROCEDURE TRAY) ×2 IMPLANT
PENCIL BUTTON HOLSTER BLD 10FT (ELECTRODE) ×2 IMPLANT
SPONGE LAP 18X18 X RAY DECT (DISPOSABLE) ×2 IMPLANT
STRIP CLOSURE SKIN 1/2X4 (GAUZE/BANDAGES/DRESSINGS) ×2 IMPLANT
SUT MNCRL AB 4-0 PS2 18 (SUTURE) ×2 IMPLANT
SUT MON AB 5-0 PS2 18 (SUTURE) ×1 IMPLANT
SUT SILK 2 0 SH (SUTURE) IMPLANT
SUT VIC AB 2-0 SH 27 (SUTURE) ×2
SUT VIC AB 2-0 SH 27XBRD (SUTURE) ×1 IMPLANT
SUT VIC AB 3-0 SH 27 (SUTURE) ×2
SUT VIC AB 3-0 SH 27X BRD (SUTURE) ×1 IMPLANT
SYR BULB 3OZ (MISCELLANEOUS) ×2 IMPLANT
SYR CONTROL 10ML LL (SYRINGE) ×2 IMPLANT
TOWEL OR 17X24 6PK STRL BLUE (TOWEL DISPOSABLE) ×2 IMPLANT
TOWEL OR 17X26 10 PK STRL BLUE (TOWEL DISPOSABLE) ×2 IMPLANT
TUBE CONNECTING 12X1/4 (SUCTIONS) ×2 IMPLANT
YANKAUER SUCT BULB TIP NO VENT (SUCTIONS) ×2 IMPLANT

## 2017-08-30 NOTE — Anesthesia Postprocedure Evaluation (Signed)
Anesthesia Post Note  Patient: [name]  Procedure(s) Performed: Procedure(s) (LRB): BREAST LUMPECTOMY WITH RADIOACTIVE SEED LOCALIZATION (Left)     Patient location during evaluation: PACU Anesthesia Type: General Level of consciousness: awake Pain management: pain level controlled Vital Signs Assessment: post-procedure vital signs reviewed and stable Respiratory status: spontaneous breathing Cardiovascular status: stable Anesthetic complications: no    Last Vitals:  Vitals:   08/30/17 1044  BP: 106/64  Pulse: 95  Resp: 18  Temp: 36.4 C  SpO2: 100%    Last Pain:  Vitals:   08/30/17 1044  TempSrc: Oral                 Clarinda Obi

## 2017-08-30 NOTE — Discharge Instructions (Signed)
[facility] °Office Phone Number [phone] °POST OP INSTRUCTIONS ° °Always review your discharge instruction sheet given to you by the facility where your surgery was performed. ° °IF YOU HAVE DISABILITY OR FAMILY LEAVE FORMS, YOU MUST BRING THEM TO THE OFFICE FOR PROCESSING.  DO NOT GIVE THEM TO YOUR DOCTOR. ° °1. A prescription for pain medication may be given to you upon discharge.  Take your pain medication as prescribed, if needed.  If narcotic pain medicine is not needed, then you may take acetaminophen (Tylenol), naprosyn (Alleve) or ibuprofen (Advil) as needed. °2. Take your usually prescribed medications unless otherwise directed °3. If you need a refill on your pain medication, please contact your pharmacy.  They will contact our office to request authorization.  Prescriptions will not be filled after 5pm or on week-ends. °4. You should eat very light the first 24 hours after surgery, such as soup, crackers, pudding, etc.  Resume your normal diet the day after surgery. °5. Most patients will experience some swelling and bruising in the breast.  Ice packs and a good support bra will help.  Wear the breast binder provided or a sports bra for 72 hours day and night.  After that wear a sports bra during the day until you return to the office. Swelling and bruising can take several days to resolve.  °6. It is common to experience some constipation if taking pain medication after surgery.  Increasing fluid intake and taking a stool softener will usually help or prevent this problem from occurring.  A mild laxative (Milk of Magnesia or Miralax) should be taken according to package directions if there are no bowel movements after 48 hours. °7. Unless discharge instructions indicate otherwise, you may remove your bandages 48 hours after surgery and you may shower at that time.  You may have steri-strips (small skin tapes) in place directly over the incision.  These strips should be left on the  skin for 7-10 days and will come off on their own.  If your surgeon used skin glue on the incision, you may shower in 24 hours.  The glue will flake off over the next 2-3 weeks.  Any sutures or staples will be removed at the office during your follow-up visit. °8. ACTIVITIES:  You may resume regular daily activities (gradually increasing) beginning the next day.  Wearing a good support bra or sports bra minimizes pain and swelling.  You may have sexual intercourse when it is comfortable. °a. You may drive when you no longer are taking prescription pain medication, you can comfortably wear a seatbelt, and you can safely maneuver your car and apply brakes. °b. RETURN TO WORK:  ______________________________________________________________________________________ °9. You should see your doctor in the office for a follow-up appointment approximately two weeks after your surgery.  Your doctor’s nurse will typically make your follow-up appointment when she calls you with your pathology report.  Expect your pathology report 3-4 business days after your surgery.  You may call to check if you do not hear from us after three days. °10. OTHER INSTRUCTIONS: _______________________________________________________________________________________________ _____________________________________________________________________________________________________________________________________ °_____________________________________________________________________________________________________________________________________ °_____________________________________________________________________________________________________________________________________ ° °WHEN TO CALL DR Abid Bolla: °1. Fever over 101.0 °2. Nausea and/or vomiting. °3. Extreme swelling or bruising. °4. Continued bleeding from incision. °5. Increased pain, redness, or drainage from the incision. ° °The clinic staff is available to answer your questions during regular  business hours.  Please don’t hesitate to call and ask to speak to one of the nurses for clinical concerns.  If you   have a medical emergency, go to the nearest emergency room or call 911.  A surgeon from [facility] is always on call at the hospital. ° °For further questions, please visit [email or web address] ° °

## 2017-08-30 NOTE — Anesthesia Preprocedure Evaluation (Signed)
Anesthesia Evaluation  Patient identified by MRN, date of birth, ID band Patient awake    Reviewed: Allergy & Precautions, NPO status , Patient's Chart, lab work & pertinent test results  Airway Mallampati: II  TM Distance: >3 FB     Dental   Pulmonary former smoker,    breath sounds clear to auscultation       Cardiovascular negative cardio ROS   Rhythm:Regular Rate:Normal     Neuro/Psych    GI/Hepatic negative GI ROS, Neg liver ROS,   Endo/Other  Hypothyroidism Hyperthyroidism   Renal/GU negative Renal ROS     Musculoskeletal   Abdominal   Peds  Hematology   Anesthesia Other Findings   Reproductive/Obstetrics                             Anesthesia Physical Anesthesia Plan  ASA: III  Anesthesia Plan: General   Post-op Pain Management:    Induction: Intravenous  PONV Risk Score and Plan: 3 and Ondansetron, Dexamethasone, Midazolam, Propofol infusion and Treatment may vary due to age or medical condition  Airway Management Planned: LMA  Additional Equipment:   Intra-op Plan:   Post-operative Plan: Extubation in OR  Informed Consent: I have reviewed the patients History and Physical, chart, labs and discussed the procedure including the risks, benefits and alternatives for the proposed anesthesia with the patient or authorized representative who has indicated his/her understanding and acceptance.   Dental advisory given  Plan Discussed with: Anesthesiologist and CRNA  Anesthesia Plan Comments:         Anesthesia Quick Evaluation

## 2017-08-30 NOTE — Transfer of Care (Signed)
Immediate Anesthesia Transfer of Care Note  Patient: [name]  Procedure(s) Performed: Procedure(s): BREAST LUMPECTOMY WITH RADIOACTIVE SEED LOCALIZATION (Left)  Patient Location: PACU  Anesthesia Type:General  Level of Consciousness: drowsy, patient cooperative and responds to stimulation  Airway & Oxygen Therapy: Patient Spontanous Breathing and Patient connected to nasal cannula oxygen  Post-op Assessment: Report given to RN and Post -op Vital signs reviewed and stable  Post vital signs: Reviewed and stable  Last Vitals:  Vitals:   08/30/17 1044  BP: 106/64  Pulse: 95  Resp: 18  Temp: 36.4 C  SpO2: 100%    Last Pain:  Vitals:   08/30/17 1044  TempSrc: Oral      Patients Stated Pain Goal: 3 (72/09/47 0962)  Complications: No apparent anesthesia complications

## 2017-08-30 NOTE — H&P (Signed)
53 yof referred by Dr Laurann Montana for newly diagnosed left breast dcis. she had no mass or nipple dc. she has no personal history and only family history in a premenopausal aunt. she had screening mm that showed c density breasts. the calcifications measure 1.2x1.1x0.7 cm in size. stereo biopsy was performed. the result is er/pr positive dcis with alh. this is intermediate grade. she is here with her husband Elta Guadeloupe who is a Stage manager.   Past Surgical History Tawni Pummel, RN; 08/10/2017 7:30 AM) Appendectomy  Breast Biopsy  Left. Hysterectomy (not due to cancer) - Partial  Knee Surgery  Left.  Diagnostic Studies History Tawni Pummel, RN; 08/10/2017 7:30 AM) Colonoscopy  1-5 years ago Mammogram  within last year Pap Smear  1-5 years ago  Medication History Tawni Pummel, RN; 08/10/2017 7:31 AM) Medications Reconciled  Social History Tawni Pummel, RN; 08/10/2017 7:30 AM) Alcohol use  Moderate alcohol use. Caffeine use  Coffee. No drug use  Tobacco use  Former smoker.  Family History Tawni Pummel, RN; 08/10/2017 7:30 AM) Alcohol Abuse  Family Members In General, Father, Mother. Breast Cancer  Family Members In General. Cancer  Family Members In General. Cerebrovascular Accident  Father. Depression  Family Members In General, Mother. Heart Disease  Brother, Family Members In General. Hypertension  Mother. Kidney Disease  Sister. Migraine Headache  Daughter.  Pregnancy / Birth History Tawni Pummel, RN; 08/10/2017 7:30 AM) Age at menarche  38 years. Age of menopause  55-50 Gravida  3 Irregular periods  Length (months) of breastfeeding  7-12 Maternal age  58-30 Para  99  Other Problems Tawni Pummel, RN; 08/10/2017 7:30 AM) Breast Cancer  Hypercholesterolemia  Lump In Breast  Oophorectomy  Left. Thyroid Disease  Transfusion history   Review of Systems Sunday Spillers Ledford RN; 08/10/2017 7:30 AM) General Not Present- Appetite Loss, Chills,  Fatigue, Fever, Night Sweats, Weight Gain and Weight Loss. Skin Not Present- Change in Wart/Mole, Dryness, Hives, Jaundice, New Lesions, Non-Healing Wounds, Rash and Ulcer. HEENT Present- Wears glasses/contact lenses. Not Present- Earache, Hearing Loss, Hoarseness, Nose Bleed, Oral Ulcers, Ringing in the Ears, Seasonal Allergies, Sinus Pain, Sore Throat, Visual Disturbances and Yellow Eyes. Respiratory Not Present- Bloody sputum, Chronic Cough, Difficulty Breathing, Snoring and Wheezing. Breast Present- Breast Mass, Breast Pain and Skin Changes. Not Present- Nipple Discharge. Cardiovascular Present- Rapid Heart Rate and Swelling of Extremities. Not Present- Chest Pain, Difficulty Breathing Lying Down, Leg Cramps, Palpitations and Shortness of Breath. Gastrointestinal Not Present- Abdominal Pain, Bloating, Bloody Stool, Change in Bowel Habits, Chronic diarrhea, Constipation, Difficulty Swallowing, Excessive gas, Gets full quickly at meals, Hemorrhoids, Indigestion, Nausea, Rectal Pain and Vomiting. Female Genitourinary Not Present- Frequency, Nocturia, Painful Urination, Pelvic Pain and Urgency. Musculoskeletal Not Present- Back Pain, Joint Pain, Joint Stiffness, Muscle Pain, Muscle Weakness and Swelling of Extremities. Neurological Not Present- Decreased Memory, Fainting, Headaches, Numbness, Seizures, Tingling, Tremor, Trouble walking and Weakness. Psychiatric Not Present- Anxiety, Bipolar, Change in Sleep Pattern, Depression, Fearful and Frequent crying. Endocrine Present- Hair Changes. Not Present- Cold Intolerance, Excessive Hunger, Heat Intolerance, Hot flashes and New Diabetes. Hematology Present- Easy Bruising. Not Present- Blood Thinners, Excessive bleeding, Gland problems, HIV and Persistent Infections.   Physical Exam Rolm Bookbinder MD; 08/10/2017 2:56 PM) General Mental Status-Alert. Orientation-Oriented X3. Chest and Lung Exam Chest and lung exam reveals -quiet, even and easy  respiratory effort with no use of accessory muscles and on auscultation, normal breath sounds, no adventitious sounds and normal vocal resonance. Breast Nipples-No Discharge. Breast Lump-No Palpable Breast Mass. Note:  lower pole of left breast hematoma Lymphatic Head & Neck General Head & Neck Lymphatics: Bilateral - Description - Normal. Axillary General Axillary Region: Bilateral - Description - Normal. Note: no [place] adenopathy   Assessment & Plan Rolm Bookbinder MD; 08/10/2017 3:06 PM) BREAST NEOPLASM, TIS (DCIS), LEFT (D05.12) Story: Left breast seed guided lumpectomy We discussed the staging and pathophysiology of breast cancer. We discussed all of the different options for treatment for breast cancer including surgery, chemotherapy, radiation therapy, Herceptin, and antiestrogen therapy. We discussed the options for treatment of the breast cancer which included lumpectomy versus a mastectomy. We discussed the performance of the lumpectomy with radioactive seed placement. We discussed a 5% chance of a positive margin requiring reexcision in the operating room. We also discussed that she will need radiation therapy if she undergoes lumpectomy. The breast cannot undergo more radiation therapy in the same breast after lumpectomy in the future. We discussed the mastectomy (removal of whole breast) and the postoperative care for that as well. Mastectomy can be followed by reconstruction. This is a more extensive surgery and requires more recovery. Most mastectomy patients will not need radiation therapy.There is also no real difference between her recurrence in the breast. I do not think she needs a node biopsy for this. will wait a couple weeks due to size of hematoma. We discussed the risks of operation including bleeding, infection, possible reoperation. She understands her further therapy will be based on what her stages at the time of her operation.

## 2017-08-30 NOTE — Interval H&P Note (Signed)
History and Physical Interval Note:  08/30/2017 12:19 PM  [name]  has presented today for surgery, with the diagnosis of LEFT BREAST CANCER  The various methods of treatment have been discussed with the patient and family. After consideration of risks, benefits and other options for treatment, the patient has consented to  Procedure(s): BREAST LUMPECTOMY WITH RADIOACTIVE SEED LOCALIZATION (Left) as a surgical intervention .  The patient's history has been reviewed, patient examined, no change in status, stable for surgery.  I have reviewed the patient's chart and labs.  Questions were answered to the patient's satisfaction.     Sharanda Shinault

## 2017-08-30 NOTE — Op Note (Signed)
Preoperative diagnosis: clinical stage 0 left breast cancer Postoperative diagnosis: same as above Procedure: Leftbreast seed guided lumpectomy Surgeon: Dr Serita Grammes EBL: minimal Anes: general  Specimens Left breast tissue marked with paint Complications none Drains none Sponge count correct Dispo to pacu stable  Indications: This is a 21 yof with int grade dcis on a left mammogram.  We discussed options and have elected to proceed with left lumpectomy with seed guidance.  Risk of positive margins discussed. She had a radioactive seed placed prior to beginning.   Procedure: After informed consent was obtained the patient was taken to the operating room.She was given antibiotics. Sequential compression devices were on her legs. She was then placed under general anesthesia with an LMA. Then she was prepped and draped in the standard sterile surgical fashion. Surgical timeout was then performed.  I then located the seed in the central left breast. I infiltrated marcaine in the skin and then made a periareolar incision to hide the scar later. I then used the neoprobe to remove the seed and the surrounding tissue with attempt to get clear margins. The deep margin is the muscle. I marked this with paint. MM confirmed removal of seed and clip.I placed clips in the cavity. I then closed the breast tissue with 2-0 vicryl  The skin was closed with 3-0 vicryl and 5-0 monocryl. Glue and steristrips were applied. A binder was placed. She was extubated and transferred to PACU.

## 2017-08-30 NOTE — Anesthesia Procedure Notes (Signed)
Procedure Name: LMA Insertion Date/Time: 08/30/2017 12:53 PM Performed by: Genelle Bal Pre-anesthesia Checklist: Patient identified, Emergency Drugs available, Suction available and Patient being monitored Patient Re-evaluated:Patient Re-evaluated prior to induction Oxygen Delivery Method: Circle system utilized Preoxygenation: Pre-oxygenation with 100% oxygen Induction Type: IV induction Ventilation: Mask ventilation without difficulty LMA: LMA inserted LMA Size: 4.0 Number of attempts: 1 Airway Equipment and Method: Bite block Placement Confirmation: positive ETCO2 and breath sounds checked- equal and bilateral Tube secured with: Tape Dental Injury: Teeth and Oropharynx as per pre-operative assessment

## 2017-08-31 ENCOUNTER — Encounter (HOSPITAL_COMMUNITY): Payer: Self-pay | Admitting: General Surgery

## 2017-09-07 NOTE — Progress Notes (Signed)
Location of Breast Cancer:Ductal carcinoma in situ (DCIS) of left breast.   Histology per Pathology Report:  Diagnosis  08-30-17 Breast, lumpectomy, Left w/seed DUCTAL CARCINOMA IN SITU, GRADE 3, SPANNING 1.5 CM ALL MARGINS OF RESECTION ARE NEGATIVE FOR CARCINOMA THE CARCINOMA IS 1 MM FROM POSTERIOR AND LATERAL MARGINS Microscopic Comment BREAST, IN SITU CARCINOMA  Receptor Status: ER(100% +), PR (80% +), Her2-neu (), Ki-()  Diagnosis 08-04-17 Breast, left, needle core biopsy, posterior upper inner - DUCTAL CARCINOMA IN SITU WITH CALCIFICATIONS. - LOBULAR NEOPLASIA (ATYPICAL LOBULAR HYPERPLASIA). - SEE COMMENT. Microscopic Comment The ductal carcinoma cells are positive for E-cadherin. The carcinoma appears intermediate grade. Estrogen receptor Receptor Status: ER(100% +), PR (80% +), Her2-neu (), Ki-()  Did patient present with symptoms (if so, please note symptoms) or was this found on screening mammography?: screening mammogram  Past/Anticipated interventions by surgeon, if any:  Dr. Rolm Bookbinder  08-30-17    Breast, lumpectomy, Left w/seed, 08-04-17 Breast, left, needle core biopsy, posterior upper inner  Past/Anticipated interventions by medical oncology, if any: Chemotherapy  Dr. Lindi Adie 08-15-17 Genetic testing negative,adjuvadjuvant radiation therapy,antiestrogen therapy with tamoxifen 5 years  Lymphedema issues, if any:  No  Pain issues, if any:  None  SAFETY ISSUES:  Prior radiation? : No  Pacemaker/ICD? : No  Possible current pregnancy? :No  Is the patient on methotrexate? No  Menarche 10   G4 P4    BC 7 years   Menopause    HRT No Current Complaints / other details:  56 y.o married woman with a family history of cancer maternal aunt breast cancer. Wt Readings from Last 3 Encounters:  09/13/17 130 lb (59 kg)  08/30/17 130 lb (59 kg)  08/25/17 130 lb 1.6 oz (59 kg)  BP 104/66   Pulse (!) 105   Temp 98.1 F (36.7 C) (Oral)   Resp 18   Ht _0   (1.702 m)   Wt 130 lb (59 kg)   SpO2 98%   BMI 20.36 kg/m    Georgena Spurling, RN 09/07/2017,12:28 PM

## 2017-09-09 ENCOUNTER — Other Ambulatory Visit: Payer: Self-pay | Admitting: Family Medicine

## 2017-09-09 DIAGNOSIS — Z129 Encounter for screening for malignant neoplasm, site unspecified: Secondary | ICD-10-CM

## 2017-09-09 DIAGNOSIS — Z87891 Personal history of nicotine dependence: Secondary | ICD-10-CM

## 2017-09-12 ENCOUNTER — Ambulatory Visit
Admission: RE | Admit: 2017-09-12 | Discharge: 2017-09-12 | Disposition: A | Discharge status: Home / Self Care | Payer: PRIVATE HEALTH INSURANCE | Source: Ambulatory Visit | Attending: Family Medicine | Admitting: Family Medicine

## 2017-09-12 DIAGNOSIS — Z87891 Personal history of nicotine dependence: Secondary | ICD-10-CM

## 2017-09-12 DIAGNOSIS — Z129 Encounter for screening for malignant neoplasm, site unspecified: Secondary | ICD-10-CM

## 2017-09-13 ENCOUNTER — Ambulatory Visit (HOSPITAL_BASED_OUTPATIENT_CLINIC_OR_DEPARTMENT_OTHER): Payer: PRIVATE HEALTH INSURANCE | Admitting: Hematology and Oncology

## 2017-09-13 ENCOUNTER — Ambulatory Visit
Admission: RE | Admit: 2017-09-13 | Discharge: 2017-09-13 | Disposition: A | Discharge status: Home / Self Care | Payer: PRIVATE HEALTH INSURANCE | Source: Ambulatory Visit | Attending: Radiation Oncology | Admitting: Radiation Oncology

## 2017-09-13 ENCOUNTER — Encounter: Payer: Self-pay | Admitting: Radiation Oncology

## 2017-09-13 VITALS — BP 104/66 | HR 105 | Temp 98.1°F | Resp 18 | Ht 67.0 in | Wt 130.0 lb

## 2017-09-13 DIAGNOSIS — Z170 Estrogen receptor positive status [ER+]: Secondary | ICD-10-CM | POA: Diagnosis not present

## 2017-09-13 DIAGNOSIS — Z87891 Personal history of nicotine dependence: Secondary | ICD-10-CM | POA: Insufficient documentation

## 2017-09-13 DIAGNOSIS — D0512 Intraductal carcinoma in situ of left breast: Secondary | ICD-10-CM

## 2017-09-13 DIAGNOSIS — Z923 Personal history of irradiation: Secondary | ICD-10-CM | POA: Diagnosis not present

## 2017-09-13 DIAGNOSIS — Z8673 Personal history of transient ischemic attack (TIA), and cerebral infarction without residual deficits: Secondary | ICD-10-CM | POA: Diagnosis not present

## 2017-09-13 DIAGNOSIS — Z510 Encounter for antineoplastic radiation therapy: Secondary | ICD-10-CM | POA: Diagnosis present

## 2017-09-13 DIAGNOSIS — E890 Postprocedural hypothyroidism: Secondary | ICD-10-CM | POA: Diagnosis not present

## 2017-09-13 DIAGNOSIS — Z90710 Acquired absence of both cervix and uterus: Secondary | ICD-10-CM | POA: Insufficient documentation

## 2017-09-13 DIAGNOSIS — M8580 Other specified disorders of bone density and structure, unspecified site: Secondary | ICD-10-CM | POA: Diagnosis not present

## 2017-09-13 DIAGNOSIS — Z79899 Other long term (current) drug therapy: Secondary | ICD-10-CM | POA: Insufficient documentation

## 2017-09-13 DIAGNOSIS — Z79890 Hormone replacement therapy (postmenopausal): Secondary | ICD-10-CM | POA: Insufficient documentation

## 2017-09-13 NOTE — Progress Notes (Signed)
Patient Care Team: Kelton Pillar, MD as PCP - General (Family Medicine) Rolm Bookbinder, MD as Consulting Physician (General Surgery) Nicholas Lose, MD as Consulting Physician (Hematology and Oncology) Kyung Rudd, MD as Consulting Physician (Radiation Oncology)  DIAGNOSIS:  Encounter Diagnosis  Name Primary?  . Ductal carcinoma in situ (DCIS) of left breast     SUMMARY OF ONCOLOGIC HISTORY:   Ductal carcinoma in situ (DCIS) of left breast   08/04/2017 Initial Diagnosis    Screening detected left breast calcifications 1.2 x 1.1 x 0.7 cm, stereotactic biopsy intermediate grade DCIS with calcifications, ALH, ER 100% PR 80%, Tis N0 stage 0      08/22/2017 Genetic Testing     Hereditary Gene Panel offered by [facility] includes sequencing and/or deletion duplication testing of the following 46 genes: APC, ATM, AXIN2, BARD1, BMPR1A, BRCA1, BRCA2, BRIP1, CDH1, CDKN2A (p14ARF), CDKN2A (p16INK4a), CHEK2, CTNNA1, DICER1, EPCAM (Deletion/duplication testing only), GREM1 (promoter region deletion/duplication testing only), KIT, MEN1, MLH1, MSH2, MSH3, MSH6, MUTYH, NBN, NF1, NHTL1, PALB2, PDGFRA, PMS2, POLD1, POLE, PTEN, RAD50, RAD51C, RAD51D, SDHB, SDHC, SDHD, SMAD4, SMARCA4. STK11, TP53, TSC1, TSC2, and VHL.  The following genes were evaluated for sequence changes only: SDHA and HOXB13 c.251G>A variant only.  Results: Negative, No pathogenic mutations identified in the 46 genes analyzed.  The date of this test report is 08/22/2017.      08/30/2017 Surgery    Left lumpectomy: DCIS grade 3, 1.5 cm, margins negative, ER 100%, PR 80%, Tis Nx stage 0       CHIEF COMPLIANT: Follow-up after left lumpectomy  INTERVAL HISTORY: [name] is a 30 year with above-mentioned history left breast DCIS treated with lumpectomy and is here today to discuss the pathology report. She is healing very well from the recent surgery and reports no major pain or discomfort.  REVIEW OF SYSTEMS:     Constitutional: Denies fevers, chills or abnormal weight loss Eyes: Denies blurriness of vision Ears, nose, mouth, throat, and face: Denies mucositis or sore throat Respiratory: Denies cough, dyspnea or wheezes Cardiovascular: Denies palpitation, chest discomfort Gastrointestinal:  Denies nausea, heartburn or change in bowel habits Skin: Denies abnormal skin rashes Lymphatics: Denies new lymphadenopathy or easy bruising Neurological:Denies numbness, tingling or new weaknesses Behavioral/Psych: Mood is stable, no new changes  Extremities: No lower extremity edema Breast: Recent left lumpectomy All other systems were reviewed with the patient and are negative.  I have reviewed the past medical history, past surgical history, social history and family history with the patient and they are unchanged from previous note.  ALLERGIES:  has No Known Allergies.  MEDICATIONS:  Current Outpatient Prescriptions  Medication Sig Dispense Refill  . Cholecalciferol (VITAMIN D3) 1000 units CAPS Take 1,000 Units by mouth daily.     Marland Kitchen ibandronate (BONIVA) 150 MG tablet Take 150 mg by mouth every 30 (thirty) days. Take in the morning with a full glass of water, on an empty stomach, and do not take anything else by mouth or lie down for the next 30 min.    Marland Kitchen ibuprofen (ADVIL,MOTRIN) 200 MG tablet Take 400 mg by mouth every 8 (eight) hours as needed (for pain.).    Marland Kitchen levothyroxine (SYNTHROID, LEVOTHROID) 75 MCG tablet Take 75 mcg by mouth daily before breakfast.     No current facility-administered medications for this visit.     PHYSICAL EXAMINATION: ECOG PERFORMANCE STATUS: 1 - Symptomatic but completely ambulatory  Vitals:   09/13/17 1500  BP: 116/68  Pulse: 87  Resp:  18  Temp: 97.9 F (36.6 C)  SpO2: 100%   Filed Weights   09/13/17 1500  Weight: 130 lb (59 kg)    GENERAL:alert, no distress and comfortable SKIN: skin color, texture, turgor are normal, no rashes or significant  lesions EYES: normal, Conjunctiva are pink and non-injected, sclera clear OROPHARYNX:no exudate, no erythema and lips, buccal mucosa, and tongue normal  NECK: supple, thyroid normal size, non-tender, without nodularity LYMPH:  no palpable lymphadenopathy in the cervical, axillary or inguinal LUNGS: clear to auscultation and percussion with normal breathing effort HEART: regular rate & rhythm and no murmurs and no lower extremity edema ABDOMEN:abdomen soft, non-tender and normal bowel sounds MUSCULOSKELETAL:no cyanosis of digits and no clubbing  NEURO: alert & oriented x 3 with fluent speech, no focal motor/sensory deficits EXTREMITIES: No lower extremity edema  LABORATORY DATA:  I have reviewed the data as listed   Chemistry      Component Value Date/Time   NA 136 08/25/2017 1339   NA 136 08/10/2017 1231   K 4.4 08/25/2017 1339   K 4.3 08/10/2017 1231   CL 102 08/25/2017 1339   CO2 24 08/25/2017 1339   CO2 25 08/10/2017 1231   BUN <5 (L) 08/25/2017 1339   BUN 6.4 (L) 08/10/2017 1231   CREATININE 0.58 08/25/2017 1339   CREATININE 0.7 08/10/2017 1231      Component Value Date/Time   CALCIUM 10.1 08/25/2017 1339   CALCIUM 10.2 08/10/2017 1231   ALKPHOS 43 08/10/2017 1231   AST 21 08/10/2017 1231   ALT 14 08/10/2017 1231   BILITOT 0.43 08/10/2017 1231       Lab Results  Component Value Date   WBC 5.0 08/25/2017   HGB 12.4 08/25/2017   HCT 36.9 08/25/2017   MCV 95.8 08/25/2017   PLT 299 08/25/2017   NEUTROABS 2.1 08/10/2017    ASSESSMENT & PLAN:  Ductal carcinoma in situ (DCIS) of left breast 08/30/2017: Left lumpectomy: DCIS grade 3, 1.5 cm, margins negative, ER 100%, PR 80%, Tis Nx stage 0  Pathology counseling: I discussed final pathology report of the patient and described that the findings of DCIS were confirmed there is no evidence of invasive breast cancer. We also discussed the previously performed ER/PR receptors.  Recommendation: 1. Adjuvant radiation  therapy followed by 2. adjuvant antiestrogen therapy with tamoxifen 5 years  Prognosis: Based upon [facility], patient's 10 year risk of recurrence with tamoxifen is at 4%. Return to clinic after radiation therapy is complete discuss tamoxifen therapy  I spent 25 minutes talking to the patient of which more than half was spent in counseling and coordination of care.  No orders of the defined types were placed in this encounter.  The patient has a good understanding of the overall plan. she agrees with it. she will call with any problems that may develop before the next visit here.   Rulon Eisenmenger, MD 09/13/17

## 2017-09-13 NOTE — Progress Notes (Signed)
Radiation Oncology         [phone] ________________________________  Name: [name]        MRN: [id number]  Date of Service: 09/13/2017 DOB: October 20, 1961   REFERRING PHYSICIAN: Nicholas Lose, MD   DIAGNOSIS: The encounter diagnosis was Ductal carcinoma in situ (DCIS) of left breast.   HISTORY OF PRESENT ILLNESS: [name] is a 56 y.o. female originally seen in the multidisciplinary breast clinic. The patient was found to have calcifications at 6:00 in the left breast on screening mammogram. Diagnostic imaging revealed a 1.2 x 1.1 x 1.7 cm mass that was biopsied stereotactically on 08/04/17 which revealed intermediate, ER/PR positive DCIS with calcifications and lobular neoplasia.  She has since undergone left lumpectomy on 08/30/2017 revealinga grade 3, 1.5 cm DCIS with all margins negative that 1 mm from the posterior and lateral margin. Her posterior margin abuts the muscle. She comes today to discuss the options of adjuvant radiotherapy.  PREVIOUS RADIATION THERAPY: Yes   Radioactive Iodine Ablation of the Thyroid in the late 1990s for hyperthyroidism  PAST MEDICAL HISTORY:  Past Medical History:  Diagnosis Date  . ADD (attention deficit disorder)    Adult  . Cancer ([place])    Left Breast  . Graves disease    1999  . Osteopenia   . Postablative hypothyroidism    after radioactive iodine therapy       PAST SURGICAL HISTORY: Past Surgical History:  Procedure Laterality Date  . APPENDECTOMY    . BREAST LUMPECTOMY WITH RADIOACTIVE SEED LOCALIZATION Left 08/30/2017   Procedure: BREAST LUMPECTOMY WITH RADIOACTIVE SEED LOCALIZATION;  Surgeon: Rolm Bookbinder, MD;  Location: [place];  Service: General;  Laterality: Left;  . increase lipoprotein    . MINOR IRRIGATION AND DEBRIDEMENT OF WOUND     Major irrigation   pelvic crest   1982  . ORIF TIBIA PLATEAU     2015  . osteoprosis     s/p fracture L tibial plateau  . VAGINAL HYSTERECTOMY       FAMILY  HISTORY:  Family History  Problem Relation Age of Onset  . Breast cancer Maternal Aunt        d.62 diagnosed in her 13s.  . Dementia Mother   . Stroke Father 15  . Brain cancer Maternal Grandmother 70       d.70s  . Lung cancer Paternal Uncle        d.60s history of smoking  . Heart attack Paternal Grandfather 90       d.72  . Breast cancer Cousin        Daughter of maternal aunt with breast cancer. Diagnosed in her 2s.     SOCIAL HISTORY:  reports that she has quit smoking. She has never used smokeless tobacco. She reports that she drinks alcohol. She reports that she does not use drugs. The patient is married and lives in [place]. She is a former Chartered loss adjuster. Her husband is a Water engineer.   ALLERGIES: Patient has no known allergies.   MEDICATIONS:  Current Outpatient Prescriptions  Medication Sig Dispense Refill  . Cholecalciferol (VITAMIN D3) 1000 units CAPS Take 1,000 Units by mouth daily.     Marland Kitchen ibandronate (BONIVA) 150 MG tablet Take 150 mg by mouth every 30 (thirty) days. Take in the morning with a full glass of water, on an empty stomach, and do not take anything else by mouth or lie down for the next 30 min.    Marland Kitchen ibuprofen (  ADVIL,MOTRIN) 200 MG tablet Take 400 mg by mouth every 8 (eight) hours as needed (for pain.).    Marland Kitchen levothyroxine (SYNTHROID, LEVOTHROID) 75 MCG tablet Take 75 mcg by mouth daily before breakfast.    . oxyCODONE-acetaminophen (PERCOCET) 10-325 MG tablet Take 1 tablet by mouth every 6 (six) hours as needed for pain. (Patient not taking: Reported on 09/13/2017) 5 tablet 0   No current facility-administered medications for this encounter.      REVIEW OF SYSTEMS: On review of systems, the patient reports that she is doing well overall. She denies any chest pain, shortness of breath, cough, fevers, chills, night sweats, unintended weight changes. She denies any bowel or bladder disturbances, and denies abdominal pain, nausea or vomiting.  She denies any new musculoskeletal or joint aches or pains. She occasionally notes right ankle edema following an injury about 8 years ago.  A complete review of systems is obtained and is otherwise negative.    PHYSICAL EXAM:  Wt Readings from Last 3 Encounters:  09/13/17 130 lb (59 kg)  08/30/17 130 lb (59 kg)  08/25/17 130 lb 1.6 oz (59 kg)   Temp Readings from Last 3 Encounters:  09/13/17 98.1 F (36.7 C) (Oral)  08/30/17 (!) 97 F (36.1 C)  08/25/17 98.2 F (36.8 C)   BP Readings from Last 3 Encounters:  09/13/17 104/66  08/30/17 119/78  08/25/17 113/65   Pulse Readings from Last 3 Encounters:  09/13/17 (!) 105  08/30/17 74  08/25/17 95    In general this is a well appearing caucasian female in no acute distress. She is alert and oriented x4 and appropriate throughout the examination. HEENT reveals that the patient is normocephalic, atraumatic. EOMs are intact. PERRLA. Skin is intact without any evidence of gross lesions. Cardiopulmonary assessment is negative for acute distress and she exhibits normal effort. The left breast is healing well and her lumpectomy scar around the areola is without separation or cellulitic changes.    ECOG = 0  0 - Asymptomatic (Fully active, able to carry on all predisease activities without restriction)  1 - Symptomatic but completely ambulatory (Restricted in physically strenuous activity but ambulatory and able to carry out work of a light or sedentary nature. For example, light housework, office work)  2 - Symptomatic, <50% in bed during the day (Ambulatory and capable of all self care but unable to carry out any work activities. Up and about more than 50% of waking hours)  3 - Symptomatic, >50% in bed, but not bedbound (Capable of only limited self-care, confined to bed or chair 50% or more of waking hours)  4 - Bedbound (Completely disabled. Cannot carry on any self-care. Totally confined to bed or chair)  5 - Death   Eustace Pen MM,  Creech RH, Tormey DC, et al. [phone]). "Toxicity and response criteria of the [facility]". [place] Oncol. 5 (6): 649-55    LABORATORY DATA:  Lab Results  Component Value Date   WBC 5.0 08/25/2017   HGB 12.4 08/25/2017   HCT 36.9 08/25/2017   MCV 95.8 08/25/2017   PLT 299 08/25/2017   Lab Results  Component Value Date   NA 136 08/25/2017   K 4.4 08/25/2017   CL 102 08/25/2017   CO2 24 08/25/2017   Lab Results  Component Value Date   ALT 14 08/10/2017   AST 21 08/10/2017   ALKPHOS 43 08/10/2017   BILITOT 0.43 08/10/2017      RADIOGRAPHY: Mm Breast  Surgical Specimen  Result Date: 08/30/2017 CLINICAL DATA:  Patient status post left lumpectomy. EXAM: SPECIMEN RADIOGRAPH OF THE LEFT BREAST COMPARISON:  Previous exam(s). FINDINGS: Status post excision of the left breast. The radioactive seed and biopsy marker clip are present, completely intact, and were marked for pathology. IMPRESSION: Specimen radiograph of the left breast. Electronically Signed   By: Lovey Newcomer M.D.   On: 08/30/2017 13:59   Ct Chest Lung Ca Screen Low Dose W/o Cm  Result Date: 09/12/2017 CLINICAL DATA:  56 year old female former smoker (quit 8 years ago) with 60 pack-year history of smoking. Lung cancer screening examination. EXAM: CT CHEST WITHOUT CONTRAST LOW-DOSE FOR LUNG CANCER SCREENING TECHNIQUE: Multidetector CT imaging of the chest was performed following the standard protocol without IV contrast. COMPARISON:  Low-dose lung cancer screening chest CT 11/07/2014. FINDINGS: Cardiovascular: Heart size is normal. There is no significant pericardial fluid, thickening or pericardial calcification. There is aortic atherosclerosis, as well as atherosclerosis of the great vessels of the mediastinum and the coronary arteries, including calcified atherosclerotic plaque in the left anterior descending and right coronary arteries. Mediastinum/Nodes: No pathologically enlarged mediastinal or hilar  lymph nodes. Please note that accurate exclusion of hilar adenopathy is limited on noncontrast CT scans. Multiple densely calcified right hilar and right paratracheal lymph nodes are again noted related old granulomatous disease. Esophagus is unremarkable in appearance. No axillary lymphadenopathy. Lungs/Pleura: Multiple small calcified noncalcified pulmonary nodules are noted throughout the lungs bilaterally. The largest noncalcified nodule is in the posterior aspect of the right lower lobe (axial image 121 of series 3) with a volume derived mean diameter of only 4.2 mm. No other larger more suspicious appearing pulmonary nodules or masses are noted. No acute consolidative airspace disease. No pleural effusions. With very mild centrilobular and paraseptal emphysema. Upper Abdomen: Unremarkable. Musculoskeletal: Focal area of architectural distortion in the deep soft tissues of the left breast where there are several surgical clips and/or radioactive seeds, compatible with patient's history of prior lumpectomy. In this region there is a 2.8 x 1.2 cm low-attenuation collection which likely represents a small seroma (axial image 38 of series 2). There are no aggressive appearing lytic or blastic lesions noted in the visualized portions of the skeleton. IMPRESSION: 1. Lung-RADS 2S, benign appearance or behavior. Continue annual screening with low-dose chest CT without contrast in 12 months. 2. The "S" modifier above refers to potentially clinically significant non lung cancer related findings. Specifically, there is aortic atherosclerosis, in addition to 2 vessel coronary artery disease. Please note that although the presence of coronary artery calcium documents the presence of coronary artery disease, the severity of this disease and any potential stenosis cannot be assessed on this non-gated CT examination. Assessment for potential risk factor modification, dietary therapy or pharmacologic therapy may be warranted,  if clinically indicated. 3. Very mild centrilobular and paraseptal emphysema. 4. Additional incidental findings, as above. Aortic Atherosclerosis (ICD10-I70.0) and Emphysema (ICD10-J43.9). Electronically Signed   By: Vinnie Langton M.D.   On: 09/12/2017 13:42   Mm Lt Radioactive Seed Loc Mammo Guide  Result Date: 08/29/2017 CLINICAL DATA:  Patient presents for radioactive seed localization prior to lumpectomy for known DCIS. EXAM: MAMMOGRAPHIC GUIDED RADIOACTIVE SEED LOCALIZATION OF THE LEFT BREAST COMPARISON:  Previous exam(s). FINDINGS: Patient presents for radioactive seed localization prior to lumpectomy for DCIS. I met with the patient and we discussed the procedure of seed localization including benefits and alternatives. We discussed the high likelihood of a successful procedure. We discussed the risks  of the procedure including infection, bleeding, tissue injury and further surgery. We discussed the low dose of radioactivity involved in the procedure. Informed, written consent was given. The usual time-out protocol was performed immediately prior to the procedure. Using mammographic guidance, sterile technique, 1% lidocaine and an I-125 radioactive seed, X shaped clip was localized using a lateral approach. The follow-up mammogram images confirm the seed in the expected location and were marked for Dr. Donne Hazel. Follow-up survey of the patient confirms presence of the radioactive seed. Order number of I-125 seed:  [id number]. Total activity:  6.950 millicurie  Reference Date: 08/23/2017 The patient tolerated the procedure well and was released from the [facility]. She was given instructions regarding seed removal. IMPRESSION: Radioactive seed localization left breast. No apparent complications. Electronically Signed   By: Nolon Nations M.D.   On: 08/29/2017 12:10       IMPRESSION/PLAN: 1. High grade, ER/PR positive DCIS of the left breast. We reviewed the final pathology findings. She has  done well since undergoing lumpectomy, and her adjuvant course would now be followed by external radiotherapy to the breast followed by antiestrogen therapy. We discussed the risks, benefits, short, and long term effects of radiotherapy, and the patient is interested in proceeding. Dr. Lisbeth Renshaw discussed the delivery and logistics of radiotherapy and would anticipate a course of 6 1/2 weeks of therapy with deep inspriation breath hold. Written consent is obtained and placed in the chart, a copy was provided to the patient. She will simulate today following her appointment.  In a visit lasting 20 minutes, greater than 50% of the time was spent face to face discussing radiation recommendations, and coordinating the patient's care.  The above documentation reflects my direct findings during this shared patient visit. Please see the separate note by Dr. Lisbeth Renshaw on this date for the remainder of the patient's plan of care.    Carola Rhine, PAC

## 2017-09-13 NOTE — Assessment & Plan Note (Signed)
08/30/2017: Left lumpectomy: DCIS grade 3, 1.5 cm, margins negative, ER 100%, PR 80%, Tis Nx stage 0  Pathology counseling: I discussed final pathology report of the patient and described that the findings of DCIS were confirmed there is no evidence of invasive breast cancer. We also discussed the previously performed ER/PR receptors.  Recommendation: 1. Adjuvant radiation therapy followed by 2. adjuvant antiestrogen therapy with tamoxifen 5 years  Return to clinic after radiation therapy is complete discuss tamoxifen therapy

## 2017-09-14 ENCOUNTER — Ambulatory Visit
Admission: RE | Admit: 2017-09-14 | Payer: PRIVATE HEALTH INSURANCE | Source: Ambulatory Visit | Admitting: Radiation Oncology

## 2017-09-15 NOTE — Progress Notes (Signed)
  Radiation Oncology         [phone] ________________________________  Name: SHARRELL KRAWIEC MRN: [id number]  Date: 09/13/2017  DOB: 03-09-1961  Optical Surface Tracking Plan:  Since intensity modulated radiotherapy (IMRT) and 3D conformal radiation treatment methods are predicated on accurate and precise positioning for treatment, intrafraction motion monitoring is medically necessary to ensure accurate and safe treatment delivery.  The ability to quantify intrafraction motion without excessive ionizing radiation dose can only be performed with optical surface tracking. Accordingly, surface imaging offers the opportunity to obtain 3D measurements of patient position throughout IMRT and 3D treatments without excessive radiation exposure.  I am ordering optical surface tracking for this patient's upcoming course of radiotherapy. ________________________________  Kyung Rudd, MD 09/15/2017 8:01 AM    Reference:   Ursula Alert, J, et al. Surface imaging-based analysis of intrafraction motion for breast radiotherapy patients.Journal of [place], n. 6, nov. 2014. ISSN [id number].   Available at: <http://www.jacmp.org/index.php/jacmp/article/view/4957>.

## 2017-09-15 NOTE — Progress Notes (Signed)
  Radiation Oncology         [phone] ________________________________  Name: [name] MRN: [id number]  Date: 09/13/2017  DOB: [birth year]  DIAGNOSIS:     ICD-10-CM   1. Ductal carcinoma in situ (DCIS) of left breast D05.12      SIMULATION AND TREATMENT PLANNING NOTE  The patient presented for simulation prior to beginning her course of radiation treatment for her diagnosis of righ-sided breast cancer. The patient was placed in a supine position on a breast board. A customized vac-lock bag was constructed and this complex treatment device will be used on a daily basis during her treatment. In this fashion, a CT scan was obtained through the chest area and an isocenter was placed near the chest wall within the breast.  The patient will be planned to receive a course of radiation initially to a dose of 50 Gy. This will consist of a whole breast radiotherapy technique. To accomplish this, 2 customized blocks have been designed which will correspond to medial and lateral whole breast tangent fields. This treatment will be accomplished at 2 Gy per fraction. A forward planning technique will also be evaluated to determine if this approach improves the plan. It is anticipated that the patient will then receive a 14.4 Gy boost to the seroma cavity which has been contoured. This will be accomplished at 1.8 Gy per fraction.   This initial treatment will consist of a 3-D conformal technique. The seroma has been contoured as the primary target structure. Additionally, dose volume histograms of both this target as well as the lungs and heart will also be evaluated. Such an approach is necessary to ensure that the target area is adequately covered while the nearby critical  normal structures are adequately spared.  Plan:  The final anticipated total dose therefore will correspond to 64.4 Gy.    _______________________________   Jodelle Gross, MD, PhD

## 2017-09-21 ENCOUNTER — Other Ambulatory Visit: Payer: Self-pay

## 2017-09-23 DIAGNOSIS — Z510 Encounter for antineoplastic radiation therapy: Secondary | ICD-10-CM | POA: Diagnosis not present

## 2017-09-27 ENCOUNTER — Ambulatory Visit
Admission: RE | Admit: 2017-09-27 | Discharge: 2017-09-27 | Disposition: A | Discharge status: Home / Self Care | Payer: PRIVATE HEALTH INSURANCE | Source: Ambulatory Visit | Attending: Radiation Oncology | Admitting: Radiation Oncology

## 2017-09-27 ENCOUNTER — Encounter: Payer: Self-pay | Admitting: Radiation Oncology

## 2017-09-27 DIAGNOSIS — D0512 Intraductal carcinoma in situ of left breast: Secondary | ICD-10-CM

## 2017-09-27 DIAGNOSIS — Z510 Encounter for antineoplastic radiation therapy: Secondary | ICD-10-CM | POA: Diagnosis not present

## 2017-09-27 MED ORDER — ALRA NON-METALLIC DEODORANT (RAD-ONC)
1.0000 "application " | Freq: Once | TOPICAL | Status: AC
  Administered 2017-09-27: 1 via TOPICAL

## 2017-09-27 MED ORDER — RADIAPLEXRX EX GEL
Freq: Once | CUTANEOUS | Status: AC
  Administered 2017-09-27: 14:00:00 via TOPICAL

## 2017-09-27 NOTE — Progress Notes (Signed)
I faxed the [facility] paperwork on September 27, 2017.

## 2017-09-27 NOTE — Progress Notes (Signed)
Pt here for patient teaching.  Pt given Radiation and You booklet, skin care instructions, Alra deodorant and Radiaplex gel.  Reviewed areas of pertinence such as fatigue, skin changes, breast tenderness and breast swelling . Pt able to give teach back of to pat skin and use unscented/gentle soap,apply Radiaplex bid, avoid applying anything to skin within 4 hours of treatment and to use an electric razor if they must shave. Pt demonstrated understanding and verbalizes understanding of information given and will contact nursing with any questions or concerns.          

## 2017-09-28 ENCOUNTER — Ambulatory Visit
Admission: RE | Admit: 2017-09-28 | Discharge: 2017-09-28 | Disposition: A | Discharge status: Home / Self Care | Payer: PRIVATE HEALTH INSURANCE | Source: Ambulatory Visit | Attending: Radiation Oncology | Admitting: Radiation Oncology

## 2017-09-28 DIAGNOSIS — Z510 Encounter for antineoplastic radiation therapy: Secondary | ICD-10-CM | POA: Diagnosis not present

## 2017-09-29 ENCOUNTER — Ambulatory Visit
Admission: RE | Admit: 2017-09-29 | Discharge: 2017-09-29 | Disposition: A | Discharge status: Home / Self Care | Payer: PRIVATE HEALTH INSURANCE | Source: Ambulatory Visit | Attending: Radiation Oncology | Admitting: Radiation Oncology

## 2017-09-29 DIAGNOSIS — Z510 Encounter for antineoplastic radiation therapy: Secondary | ICD-10-CM | POA: Diagnosis not present

## 2017-09-30 ENCOUNTER — Ambulatory Visit
Admission: RE | Admit: 2017-09-30 | Discharge: 2017-09-30 | Disposition: A | Discharge status: Home / Self Care | Payer: PRIVATE HEALTH INSURANCE | Source: Ambulatory Visit | Attending: Radiation Oncology | Admitting: Radiation Oncology

## 2017-09-30 DIAGNOSIS — Z510 Encounter for antineoplastic radiation therapy: Secondary | ICD-10-CM | POA: Diagnosis not present

## 2017-10-03 ENCOUNTER — Ambulatory Visit
Admission: RE | Admit: 2017-10-03 | Discharge: 2017-10-03 | Disposition: A | Discharge status: Home / Self Care | Payer: PRIVATE HEALTH INSURANCE | Source: Ambulatory Visit | Attending: Radiation Oncology | Admitting: Radiation Oncology

## 2017-10-03 DIAGNOSIS — Z510 Encounter for antineoplastic radiation therapy: Secondary | ICD-10-CM | POA: Diagnosis not present

## 2017-10-04 ENCOUNTER — Ambulatory Visit
Admission: RE | Admit: 2017-10-04 | Discharge: 2017-10-04 | Disposition: A | Discharge status: Home / Self Care | Payer: PRIVATE HEALTH INSURANCE | Source: Ambulatory Visit | Attending: Radiation Oncology | Admitting: Radiation Oncology

## 2017-10-04 DIAGNOSIS — Z510 Encounter for antineoplastic radiation therapy: Secondary | ICD-10-CM | POA: Diagnosis not present

## 2017-10-05 ENCOUNTER — Ambulatory Visit
Admission: RE | Admit: 2017-10-05 | Discharge: 2017-10-05 | Disposition: A | Discharge status: Home / Self Care | Payer: PRIVATE HEALTH INSURANCE | Source: Ambulatory Visit | Attending: Radiation Oncology | Admitting: Radiation Oncology

## 2017-10-05 DIAGNOSIS — Z510 Encounter for antineoplastic radiation therapy: Secondary | ICD-10-CM | POA: Diagnosis not present

## 2017-10-06 ENCOUNTER — Ambulatory Visit
Admission: RE | Admit: 2017-10-06 | Discharge: 2017-10-06 | Disposition: A | Discharge status: Home / Self Care | Payer: PRIVATE HEALTH INSURANCE | Source: Ambulatory Visit | Attending: Radiation Oncology | Admitting: Radiation Oncology

## 2017-10-06 DIAGNOSIS — Z510 Encounter for antineoplastic radiation therapy: Secondary | ICD-10-CM | POA: Diagnosis not present

## 2017-10-07 ENCOUNTER — Ambulatory Visit
Admission: RE | Admit: 2017-10-07 | Discharge: 2017-10-07 | Disposition: A | Discharge status: Home / Self Care | Payer: PRIVATE HEALTH INSURANCE | Source: Ambulatory Visit | Attending: Radiation Oncology | Admitting: Radiation Oncology

## 2017-10-07 DIAGNOSIS — Z510 Encounter for antineoplastic radiation therapy: Secondary | ICD-10-CM | POA: Diagnosis not present

## 2017-10-10 ENCOUNTER — Ambulatory Visit
Admission: RE | Admit: 2017-10-10 | Discharge: 2017-10-10 | Disposition: A | Discharge status: Home / Self Care | Payer: PRIVATE HEALTH INSURANCE | Source: Ambulatory Visit | Attending: Radiation Oncology | Admitting: Radiation Oncology

## 2017-10-10 DIAGNOSIS — Z510 Encounter for antineoplastic radiation therapy: Secondary | ICD-10-CM | POA: Diagnosis not present

## 2017-10-11 ENCOUNTER — Ambulatory Visit
Admission: RE | Admit: 2017-10-11 | Discharge: 2017-10-11 | Disposition: A | Discharge status: Home / Self Care | Payer: PRIVATE HEALTH INSURANCE | Source: Ambulatory Visit | Attending: Radiation Oncology | Admitting: Radiation Oncology

## 2017-10-11 DIAGNOSIS — Z510 Encounter for antineoplastic radiation therapy: Secondary | ICD-10-CM | POA: Diagnosis not present

## 2017-10-12 ENCOUNTER — Ambulatory Visit
Admission: RE | Admit: 2017-10-12 | Discharge: 2017-10-12 | Disposition: A | Discharge status: Home / Self Care | Payer: PRIVATE HEALTH INSURANCE | Source: Ambulatory Visit | Attending: Radiation Oncology | Admitting: Radiation Oncology

## 2017-10-12 DIAGNOSIS — Z510 Encounter for antineoplastic radiation therapy: Secondary | ICD-10-CM | POA: Diagnosis not present

## 2017-10-13 ENCOUNTER — Ambulatory Visit
Admission: RE | Admit: 2017-10-13 | Discharge: 2017-10-13 | Disposition: A | Discharge status: Home / Self Care | Payer: PRIVATE HEALTH INSURANCE | Source: Ambulatory Visit | Attending: Radiation Oncology | Admitting: Radiation Oncology

## 2017-10-13 DIAGNOSIS — Z510 Encounter for antineoplastic radiation therapy: Secondary | ICD-10-CM | POA: Diagnosis not present

## 2017-10-14 ENCOUNTER — Ambulatory Visit
Admission: RE | Admit: 2017-10-14 | Discharge: 2017-10-14 | Disposition: A | Discharge status: Home / Self Care | Payer: PRIVATE HEALTH INSURANCE | Source: Ambulatory Visit | Attending: Radiation Oncology | Admitting: Radiation Oncology

## 2017-10-14 DIAGNOSIS — Z510 Encounter for antineoplastic radiation therapy: Secondary | ICD-10-CM | POA: Diagnosis not present

## 2017-10-17 ENCOUNTER — Ambulatory Visit
Admission: RE | Admit: 2017-10-17 | Discharge: 2017-10-17 | Disposition: A | Discharge status: Home / Self Care | Payer: PRIVATE HEALTH INSURANCE | Source: Ambulatory Visit | Attending: Radiation Oncology | Admitting: Radiation Oncology

## 2017-10-17 DIAGNOSIS — Z510 Encounter for antineoplastic radiation therapy: Secondary | ICD-10-CM | POA: Diagnosis not present

## 2017-10-18 ENCOUNTER — Ambulatory Visit
Admission: RE | Admit: 2017-10-18 | Discharge: 2017-10-18 | Disposition: A | Discharge status: Home / Self Care | Payer: PRIVATE HEALTH INSURANCE | Source: Ambulatory Visit | Attending: Radiation Oncology | Admitting: Radiation Oncology

## 2017-10-18 DIAGNOSIS — Z510 Encounter for antineoplastic radiation therapy: Secondary | ICD-10-CM | POA: Diagnosis not present

## 2017-10-19 ENCOUNTER — Ambulatory Visit
Admission: RE | Admit: 2017-10-19 | Discharge: 2017-10-19 | Disposition: A | Discharge status: Home / Self Care | Payer: PRIVATE HEALTH INSURANCE | Source: Ambulatory Visit | Attending: Radiation Oncology | Admitting: Radiation Oncology

## 2017-10-19 DIAGNOSIS — Z510 Encounter for antineoplastic radiation therapy: Secondary | ICD-10-CM | POA: Diagnosis not present

## 2017-10-20 ENCOUNTER — Ambulatory Visit
Admission: RE | Admit: 2017-10-20 | Discharge: 2017-10-20 | Disposition: A | Discharge status: Home / Self Care | Payer: PRIVATE HEALTH INSURANCE | Source: Ambulatory Visit | Attending: Radiation Oncology | Admitting: Radiation Oncology

## 2017-10-20 DIAGNOSIS — Z510 Encounter for antineoplastic radiation therapy: Secondary | ICD-10-CM | POA: Diagnosis not present

## 2017-10-21 ENCOUNTER — Ambulatory Visit
Admission: RE | Admit: 2017-10-21 | Discharge: 2017-10-21 | Disposition: A | Discharge status: Home / Self Care | Payer: PRIVATE HEALTH INSURANCE | Source: Ambulatory Visit | Attending: Radiation Oncology | Admitting: Radiation Oncology

## 2017-10-21 DIAGNOSIS — Z510 Encounter for antineoplastic radiation therapy: Secondary | ICD-10-CM | POA: Diagnosis not present

## 2017-10-23 ENCOUNTER — Ambulatory Visit
Admission: RE | Admit: 2017-10-23 | Discharge: 2017-10-23 | Disposition: A | Discharge status: Home / Self Care | Payer: PRIVATE HEALTH INSURANCE | Source: Ambulatory Visit | Attending: Radiation Oncology | Admitting: Radiation Oncology

## 2017-10-23 DIAGNOSIS — Z510 Encounter for antineoplastic radiation therapy: Secondary | ICD-10-CM | POA: Diagnosis not present

## 2017-10-24 ENCOUNTER — Ambulatory Visit
Admission: RE | Admit: 2017-10-24 | Discharge: 2017-10-24 | Disposition: A | Discharge status: Home / Self Care | Payer: PRIVATE HEALTH INSURANCE | Source: Ambulatory Visit | Attending: Radiation Oncology | Admitting: Radiation Oncology

## 2017-10-24 DIAGNOSIS — Z510 Encounter for antineoplastic radiation therapy: Secondary | ICD-10-CM | POA: Diagnosis not present

## 2017-10-25 ENCOUNTER — Ambulatory Visit
Admission: RE | Admit: 2017-10-25 | Discharge: 2017-10-25 | Disposition: A | Discharge status: Home / Self Care | Payer: PRIVATE HEALTH INSURANCE | Source: Ambulatory Visit | Attending: Radiation Oncology | Admitting: Radiation Oncology

## 2017-10-25 DIAGNOSIS — Z510 Encounter for antineoplastic radiation therapy: Secondary | ICD-10-CM | POA: Diagnosis not present

## 2017-10-26 ENCOUNTER — Ambulatory Visit
Admission: RE | Admit: 2017-10-26 | Discharge: 2017-10-26 | Disposition: A | Discharge status: Home / Self Care | Payer: PRIVATE HEALTH INSURANCE | Source: Ambulatory Visit | Attending: Radiation Oncology | Admitting: Radiation Oncology

## 2017-10-26 DIAGNOSIS — Z510 Encounter for antineoplastic radiation therapy: Secondary | ICD-10-CM | POA: Diagnosis not present

## 2017-10-31 ENCOUNTER — Ambulatory Visit
Admission: RE | Admit: 2017-10-31 | Discharge: 2017-10-31 | Disposition: A | Discharge status: Home / Self Care | Payer: PRIVATE HEALTH INSURANCE | Source: Ambulatory Visit | Attending: Radiation Oncology | Admitting: Radiation Oncology

## 2017-10-31 ENCOUNTER — Ambulatory Visit: Payer: PRIVATE HEALTH INSURANCE

## 2017-10-31 DIAGNOSIS — Z510 Encounter for antineoplastic radiation therapy: Secondary | ICD-10-CM | POA: Diagnosis not present

## 2017-11-01 ENCOUNTER — Ambulatory Visit: Payer: PRIVATE HEALTH INSURANCE | Admitting: Radiation Oncology

## 2017-11-01 ENCOUNTER — Ambulatory Visit
Admission: RE | Admit: 2017-11-01 | Discharge: 2017-11-01 | Disposition: A | Discharge status: Home / Self Care | Payer: PRIVATE HEALTH INSURANCE | Source: Ambulatory Visit | Attending: Radiation Oncology | Admitting: Radiation Oncology

## 2017-11-01 DIAGNOSIS — Z510 Encounter for antineoplastic radiation therapy: Secondary | ICD-10-CM | POA: Diagnosis not present

## 2017-11-02 ENCOUNTER — Ambulatory Visit
Admission: RE | Admit: 2017-11-02 | Discharge: 2017-11-02 | Disposition: A | Discharge status: Home / Self Care | Payer: PRIVATE HEALTH INSURANCE | Source: Ambulatory Visit | Attending: Radiation Oncology | Admitting: Radiation Oncology

## 2017-11-02 DIAGNOSIS — Z510 Encounter for antineoplastic radiation therapy: Secondary | ICD-10-CM | POA: Diagnosis not present

## 2017-11-03 ENCOUNTER — Ambulatory Visit
Admission: RE | Admit: 2017-11-03 | Discharge: 2017-11-03 | Disposition: A | Discharge status: Home / Self Care | Payer: PRIVATE HEALTH INSURANCE | Source: Ambulatory Visit | Attending: Radiation Oncology | Admitting: Radiation Oncology

## 2017-11-03 ENCOUNTER — Ambulatory Visit: Payer: PRIVATE HEALTH INSURANCE

## 2017-11-03 DIAGNOSIS — Z510 Encounter for antineoplastic radiation therapy: Secondary | ICD-10-CM | POA: Diagnosis not present

## 2017-11-04 ENCOUNTER — Ambulatory Visit: Payer: PRIVATE HEALTH INSURANCE

## 2017-11-04 ENCOUNTER — Ambulatory Visit
Admission: RE | Admit: 2017-11-04 | Discharge: 2017-11-04 | Disposition: A | Discharge status: Home / Self Care | Payer: PRIVATE HEALTH INSURANCE | Source: Ambulatory Visit | Attending: Radiation Oncology | Admitting: Radiation Oncology

## 2017-11-04 DIAGNOSIS — Z510 Encounter for antineoplastic radiation therapy: Secondary | ICD-10-CM | POA: Diagnosis not present

## 2017-11-07 ENCOUNTER — Ambulatory Visit: Payer: PRIVATE HEALTH INSURANCE

## 2017-11-08 ENCOUNTER — Ambulatory Visit
Admission: RE | Admit: 2017-11-08 | Discharge: 2017-11-08 | Disposition: A | Discharge status: Home / Self Care | Payer: PRIVATE HEALTH INSURANCE | Source: Ambulatory Visit | Attending: Radiation Oncology | Admitting: Radiation Oncology

## 2017-11-08 DIAGNOSIS — Z510 Encounter for antineoplastic radiation therapy: Secondary | ICD-10-CM | POA: Diagnosis not present

## 2017-11-09 ENCOUNTER — Ambulatory Visit
Admission: RE | Admit: 2017-11-09 | Discharge: 2017-11-09 | Disposition: A | Discharge status: Home / Self Care | Payer: PRIVATE HEALTH INSURANCE | Source: Ambulatory Visit | Attending: Radiation Oncology | Admitting: Radiation Oncology

## 2017-11-09 DIAGNOSIS — Z510 Encounter for antineoplastic radiation therapy: Secondary | ICD-10-CM | POA: Diagnosis not present

## 2017-11-10 ENCOUNTER — Ambulatory Visit
Admission: RE | Admit: 2017-11-10 | Discharge: 2017-11-10 | Disposition: A | Discharge status: Home / Self Care | Payer: PRIVATE HEALTH INSURANCE | Source: Ambulatory Visit | Attending: Radiation Oncology | Admitting: Radiation Oncology

## 2017-11-10 DIAGNOSIS — Z510 Encounter for antineoplastic radiation therapy: Secondary | ICD-10-CM | POA: Diagnosis not present

## 2017-11-11 ENCOUNTER — Ambulatory Visit
Admission: RE | Admit: 2017-11-11 | Discharge: 2017-11-11 | Disposition: A | Discharge status: Home / Self Care | Payer: PRIVATE HEALTH INSURANCE | Source: Ambulatory Visit | Attending: Radiation Oncology | Admitting: Radiation Oncology

## 2017-11-11 DIAGNOSIS — Z510 Encounter for antineoplastic radiation therapy: Secondary | ICD-10-CM | POA: Diagnosis not present

## 2017-11-14 ENCOUNTER — Ambulatory Visit: Payer: PRIVATE HEALTH INSURANCE

## 2017-11-15 ENCOUNTER — Ambulatory Visit
Admission: RE | Admit: 2017-11-15 | Discharge: 2017-11-15 | Disposition: A | Discharge status: Home / Self Care | Payer: PRIVATE HEALTH INSURANCE | Source: Ambulatory Visit | Attending: Radiation Oncology | Admitting: Radiation Oncology

## 2017-11-15 ENCOUNTER — Ambulatory Visit: Payer: PRIVATE HEALTH INSURANCE

## 2017-11-15 DIAGNOSIS — Z510 Encounter for antineoplastic radiation therapy: Secondary | ICD-10-CM | POA: Diagnosis not present

## 2017-11-17 ENCOUNTER — Encounter: Payer: Self-pay | Admitting: Radiation Oncology

## 2017-11-17 ENCOUNTER — Ambulatory Visit (HOSPITAL_BASED_OUTPATIENT_CLINIC_OR_DEPARTMENT_OTHER): Payer: PRIVATE HEALTH INSURANCE | Admitting: Hematology and Oncology

## 2017-11-17 DIAGNOSIS — D0512 Intraductal carcinoma in situ of left breast: Secondary | ICD-10-CM

## 2017-11-17 MED ORDER — TAMOXIFEN CITRATE 20 MG PO TABS: 20.0000 mg | ORAL_TABLET | Freq: Every day | ORAL | 3 refills | Status: DC

## 2017-11-17 NOTE — Assessment & Plan Note (Addendum)
08/30/2017: Left lumpectomy: DCIS grade 3, 1.5 cm, margins negative, ER 100%, PR 80%, Tis Nx stage 0 Adjuvant radiation therapy 09/28/2017-11/15/2017  Prognosis: Based upon [facility], patient's 10 year risk of recurrence with tamoxifen is at 4%  Recommendation: Adjuvant tamoxifen 20 mg daily times 5 years  We discussed the risks and benefits of tamoxifen. These include but not limited to insomnia, hot flashes, mood changes, vaginal dryness, and weight gain. Although rare, serious side effects including endometrial cancer, risk of blood clots were also discussed. We strongly believe that the benefits far outweigh the risks. Patient understands these risks and consented to starting treatment. Planned treatment duration is 5 years. Patient will start to tamoxifen January 2019  Return to clinic in April 2018 for survivorship care plan visit

## 2017-11-17 NOTE — Progress Notes (Signed)
  Radiation Oncology         [phone] ________________________________  Name: [name] MRN: [id number]  Date: 11/17/2017  DOB: 1961/05/25  End of Treatment Note  Diagnosis:   High grade, ER/PR positive DCIS of the left breast     Indication for treatment:  Curative       Radiation treatment dates:   09/28/17-11/15/17  Site/dose:   1) Left Breast/ 50 Gy in 25 fractions   2) Left Breast Boost/ 14.4 Gy in 8 fractions  Beams/energy:   1) 3D/ 6X    2) 3D/ 6X  Narrative: The patient tolerated radiation treatment relatively well.  She presented with hyperpigmentation and dry desquamation in the treatment area. She complained of pain to the treatment area.  Plan: The patient has completed radiation treatment. The patient will return to radiation oncology clinic for routine followup in one month. I advised them to call or return sooner if they have any questions or concerns related to their recovery or treatment.  ------------------------------------------------  Jodelle Gross, MD, PhD  This document serves as a record of services personally performed by Kyung Rudd, MD. It was created on his behalf by Bethann Humble, a trained medical scribe. The creation of this record is based on the scribe's personal observations and the provider's statements to them. This document has been checked and approved by the attending provider.

## 2017-11-17 NOTE — Progress Notes (Signed)
Patient Care Team: Kelton Pillar, MD as PCP - General (Family Medicine) Rolm Bookbinder, MD as Consulting Physician (General Surgery) Nicholas Lose, MD as Consulting Physician (Hematology and Oncology) Kyung Rudd, MD as Consulting Physician (Radiation Oncology)  DIAGNOSIS:  Encounter Diagnosis  Name Primary?  . Ductal carcinoma in situ (DCIS) of left breast     SUMMARY OF ONCOLOGIC HISTORY:   Ductal carcinoma in situ (DCIS) of left breast   08/04/2017 Initial Diagnosis    Screening detected left breast calcifications 1.2 x 1.1 x 0.7 cm, stereotactic biopsy intermediate grade DCIS with calcifications, ALH, ER 100% PR 80%, Tis N0 stage 0      08/22/2017 Genetic Testing     Hereditary Gene Panel offered by [facility] includes sequencing and/or deletion duplication testing of the following 46 genes: APC, ATM, AXIN2, BARD1, BMPR1A, BRCA1, BRCA2, BRIP1, CDH1, CDKN2A (p14ARF), CDKN2A (p16INK4a), CHEK2, CTNNA1, DICER1, EPCAM (Deletion/duplication testing only), GREM1 (promoter region deletion/duplication testing only), KIT, MEN1, MLH1, MSH2, MSH3, MSH6, MUTYH, NBN, NF1, NHTL1, PALB2, PDGFRA, PMS2, POLD1, POLE, PTEN, RAD50, RAD51C, RAD51D, SDHB, SDHC, SDHD, SMAD4, SMARCA4. STK11, TP53, TSC1, TSC2, and VHL.  The following genes were evaluated for sequence changes only: SDHA and HOXB13 c.251G>A variant only.  Results: Negative, No pathogenic mutations identified in the 46 genes analyzed.  The date of this test report is 08/22/2017.      08/30/2017 Surgery    Left lumpectomy: DCIS grade 3, 1.5 cm, margins negative, ER 100%, PR 80%, Tis Nx stage 0      09/28/2017 - 11/15/2017 Radiation Therapy    Adjuvant radiation therapy       CHIEF COMPLIANT: Follow-up after radiation therapy is complete  INTERVAL HISTORY: [name] is a 56 year old with above-mentioned history left breast DCIS who underwent lumpectomy and radiation is here to discuss starting antiestrogen therapy with  tamoxifen.  She is healing from the effects of radiation.  She has changed her lifestyle and had become vegan.  REVIEW OF SYSTEMS:   Constitutional: Denies fevers, chills or abnormal weight loss Eyes: Denies blurriness of vision Ears, nose, mouth, throat, and face: Denies mucositis or sore throat Respiratory: Denies cough, dyspnea or wheezes Cardiovascular: Denies palpitation, chest discomfort Gastrointestinal:  Denies nausea, heartburn or change in bowel habits Skin: Denies abnormal skin rashes Lymphatics: Denies new lymphadenopathy or easy bruising Neurological:Denies numbness, tingling or new weaknesses Behavioral/Psych: Mood is stable, no new changes  Extremities: No lower extremity edema Breast: Mild radiation dermatitis All other systems were reviewed with the patient and are negative.  I have reviewed the past medical history, past surgical history, social history and family history with the patient and they are unchanged from previous note.  ALLERGIES:  has No Known Allergies.  MEDICATIONS:  Current Outpatient Medications  Medication Sig Dispense Refill  . Cholecalciferol (VITAMIN D3) 1000 units CAPS Take 1,000 Units by mouth daily.     . hyaluronate sodium (RADIAPLEXRX) GEL Apply 1 application topically 2 (two) times daily.    Marland Kitchen ibandronate (BONIVA) 150 MG tablet Take 150 mg by mouth every 30 (thirty) days. Take in the morning with a full glass of water, on an empty stomach, and do not take anything else by mouth or lie down for the next 30 min.    Marland Kitchen ibuprofen (ADVIL,MOTRIN) 200 MG tablet Take 400 mg by mouth every 8 (eight) hours as needed (for pain.).    Marland Kitchen levothyroxine (SYNTHROID, LEVOTHROID) 75 MCG tablet Take 75 mcg by mouth daily before breakfast.    .  non-metallic deodorant (ALRA) MISC Apply 1 application topically daily as needed.    . tamoxifen (NOLVADEX) 20 MG tablet Take 1 tablet (20 mg total) by mouth daily. 90 tablet 3   No current facility-administered  medications for this visit.     PHYSICAL EXAMINATION: ECOG PERFORMANCE STATUS: 1 - Symptomatic but completely ambulatory  Vitals:   11/17/17 1413  BP: 109/73  Pulse: 88  Resp: 20  Temp: 97.7 F (36.5 C)  SpO2: 100%   Filed Weights   11/17/17 1413  Weight: 126 lb 3.2 oz (57.2 kg)    GENERAL:alert, no distress and comfortable SKIN: skin color, texture, turgor are normal, no rashes or significant lesions EYES: normal, Conjunctiva are pink and non-injected, sclera clear OROPHARYNX:no exudate, no erythema and lips, buccal mucosa, and tongue normal  NECK: supple, thyroid normal size, non-tender, without nodularity LYMPH:  no palpable lymphadenopathy in the cervical, axillary or inguinal LUNGS: clear to auscultation and percussion with normal breathing effort HEART: regular rate & rhythm and no murmurs and no lower extremity edema ABDOMEN:abdomen soft, non-tender and normal bowel sounds MUSCULOSKELETAL:no cyanosis of digits and no clubbing  NEURO: alert & oriented x 3 with fluent speech, no focal motor/sensory deficits EXTREMITIES: No lower extremity edema  LABORATORY DATA:  I have reviewed the data as listed   Chemistry      Component Value Date/Time   NA 136 08/25/2017 1339   NA 136 08/10/2017 1231   K 4.4 08/25/2017 1339   K 4.3 08/10/2017 1231   CL 102 08/25/2017 1339   CO2 24 08/25/2017 1339   CO2 25 08/10/2017 1231   BUN <5 (L) 08/25/2017 1339   BUN 6.4 (L) 08/10/2017 1231   CREATININE 0.58 08/25/2017 1339   CREATININE 0.7 08/10/2017 1231      Component Value Date/Time   CALCIUM 10.1 08/25/2017 1339   CALCIUM 10.2 08/10/2017 1231   ALKPHOS 43 08/10/2017 1231   AST 21 08/10/2017 1231   ALT 14 08/10/2017 1231   BILITOT 0.43 08/10/2017 1231       Lab Results  Component Value Date   WBC 5.0 08/25/2017   HGB 12.4 08/25/2017   HCT 36.9 08/25/2017   MCV 95.8 08/25/2017   PLT 299 08/25/2017   NEUTROABS 2.1 08/10/2017    ASSESSMENT & PLAN:  Ductal  carcinoma in situ (DCIS) of left breast 08/30/2017: Left lumpectomy: DCIS grade 3, 1.5 cm, margins negative, ER 100%, PR 80%, Tis Nx stage 0 Adjuvant radiation therapy 09/28/2017-11/15/2017  Prognosis: Based upon [facility], patient's 10 year risk of recurrence with tamoxifen is at 4%  Recommendation: Adjuvant tamoxifen 20 mg daily times 5 years  We discussed the risks and benefits of tamoxifen. These include but not limited to insomnia, hot flashes, mood changes, vaginal dryness, and weight gain. Although rare, serious side effects including endometrial cancer, risk of blood clots were also discussed. We strongly believe that the benefits far outweigh the risks. Patient understands these risks and consented to starting treatment. Planned treatment duration is 5 years. Patient will start to tamoxifen January 2019  Return to clinic in April 2018 for survivorship care plan visit  I spent 25 minutes talking to the patient of which more than half was spent in counseling and coordination of care.  No orders of the defined types were placed in this encounter.  The patient has a good understanding of the overall plan. she agrees with it. she will call with any problems that may develop before the next  visit here.   Rulon Eisenmenger, MD 11/17/17

## 2017-11-19 ENCOUNTER — Telehealth: Payer: Self-pay | Admitting: Hematology and Oncology

## 2017-11-19 NOTE — Telephone Encounter (Signed)
Mailed April SCP visit.

## 2017-12-22 IMAGING — CT CT CHEST LUNG CANCER SCREENING LOW DOSE W/O CM
1 of 5 series · 14 of 40 positions shown, 18 images · non-contrast
Comparison: Low-dose lung cancer screening chest CT 11/07/2014.

CLINICAL DATA: 56-year-old female former smoker (quit 8 years ago)
with 60 pack-year history of smoking. Lung cancer screening
examination.

EXAM:
CT CHEST WITHOUT CONTRAST LOW-DOSE FOR LUNG CANCER SCREENING
TECHNIQUE: Multidetector CT imaging of the chest was performed following the
standard protocol without IV contrast.

[Series 3: lung windows · axial · 0.62mm/px · z∈[-268,+16]mm · 14 of 257 slices shown, 18 images]
[im 15/257  mediastinal]
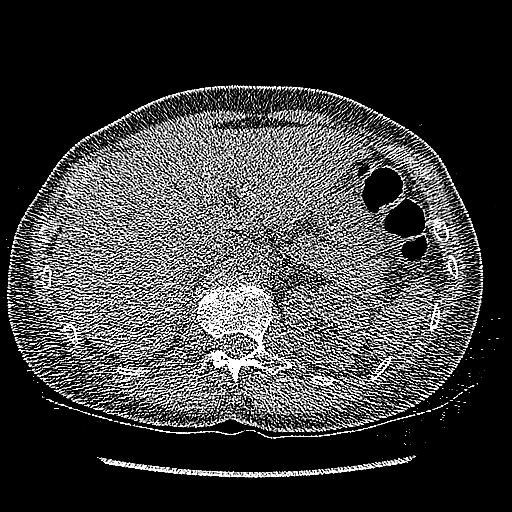
[im 15/257  lung]
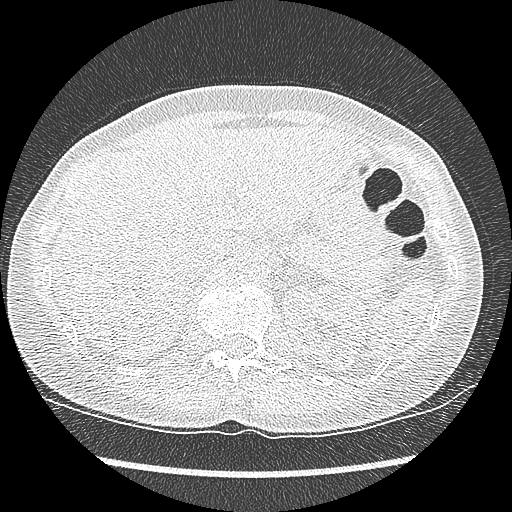
[im 29/257  lung]
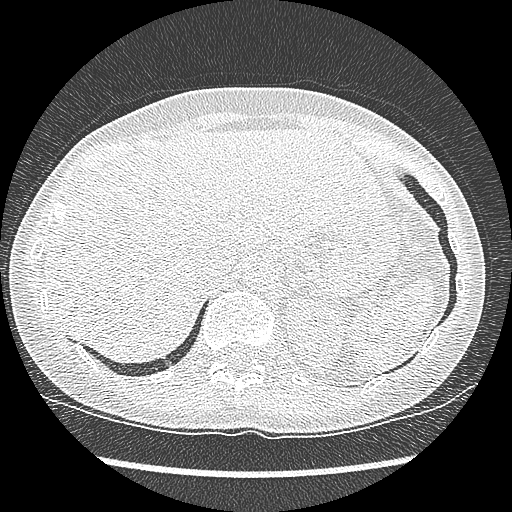
[im 57/257  lung]
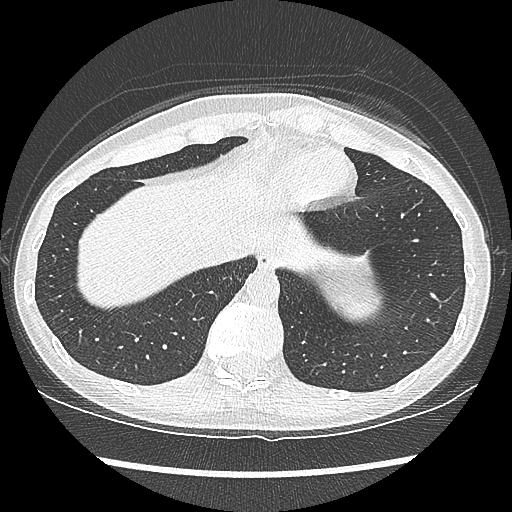
[im 72/257  lung]
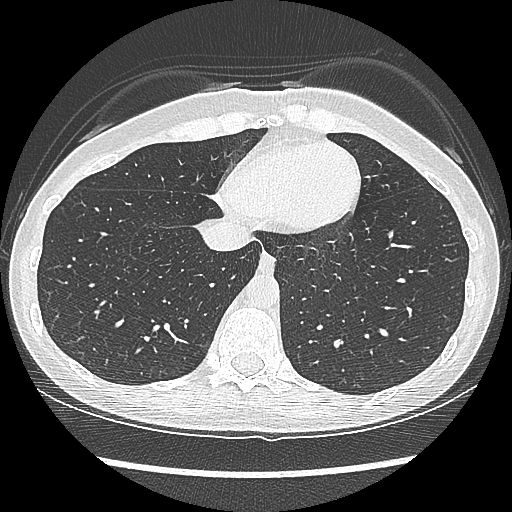
[im 86/257  mediastinal]
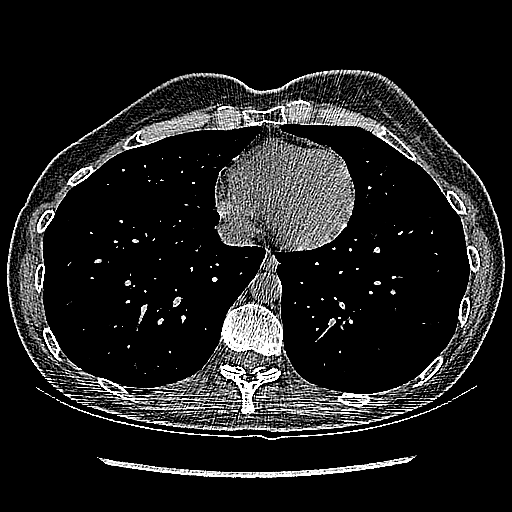
[im 86/257  lung]
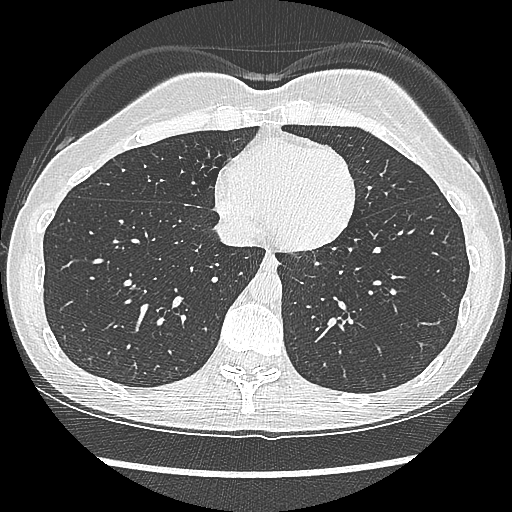
[im 100/257  lung]
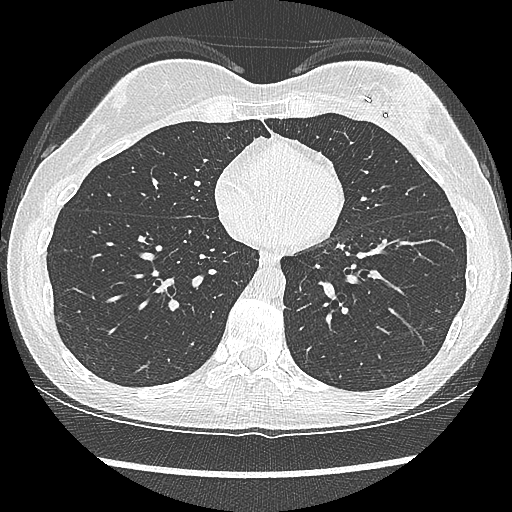
[im 114/257  lung]
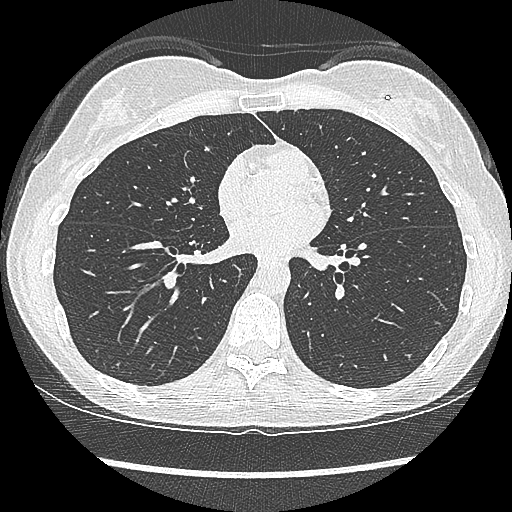
[im 143/257  lung]
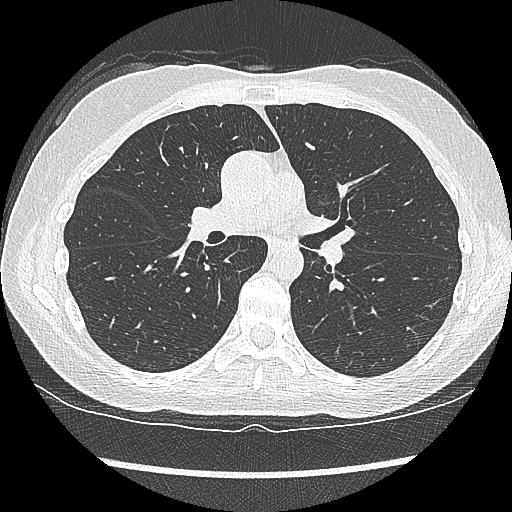
[im 157/257  mediastinal]
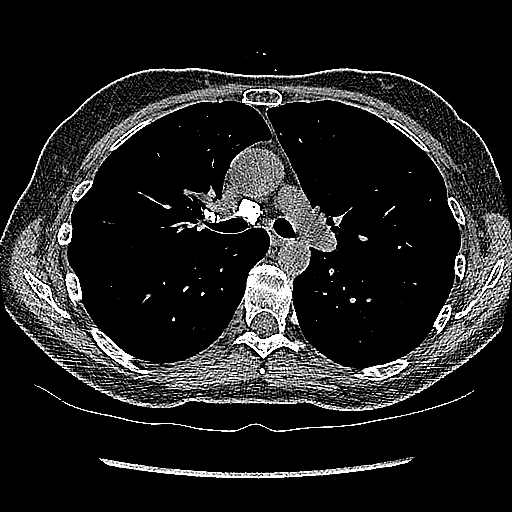
[im 157/257  lung]
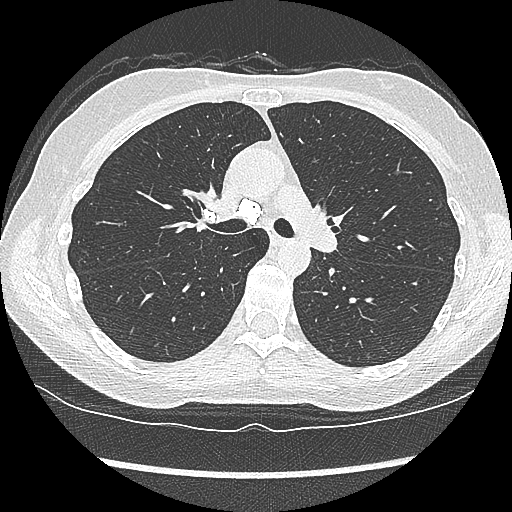
[im 171/257  lung]
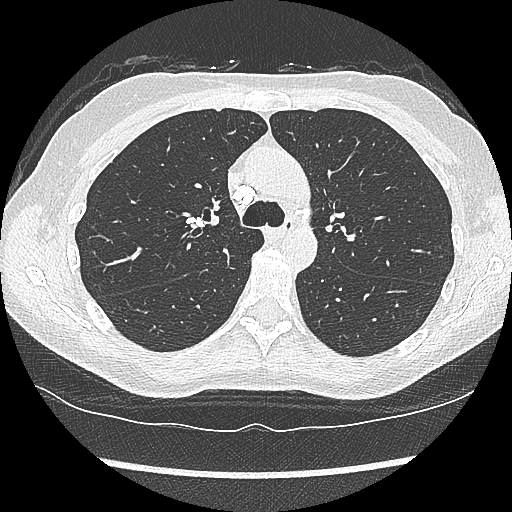
[im 185/257  lung]
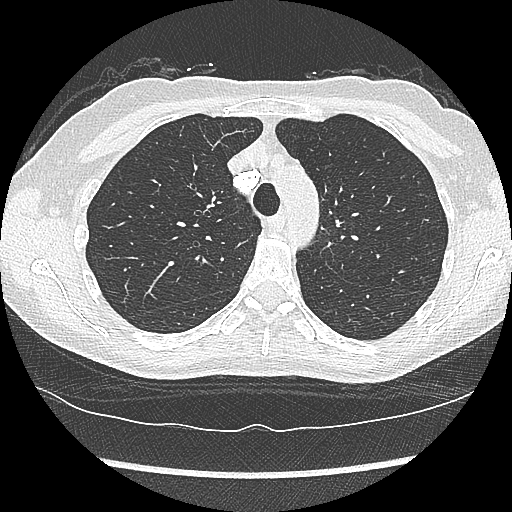
[im 200/257  lung]
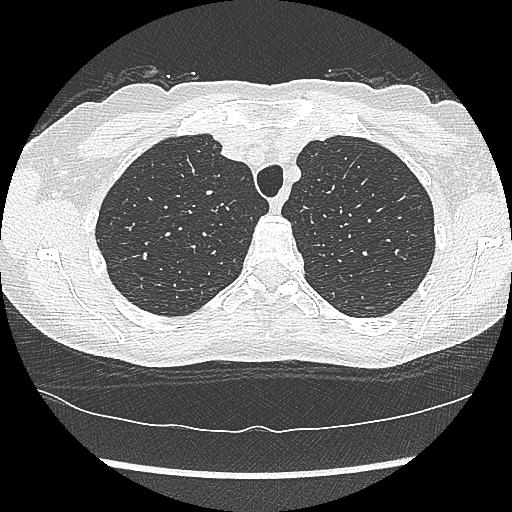
[im 228/257  mediastinal]
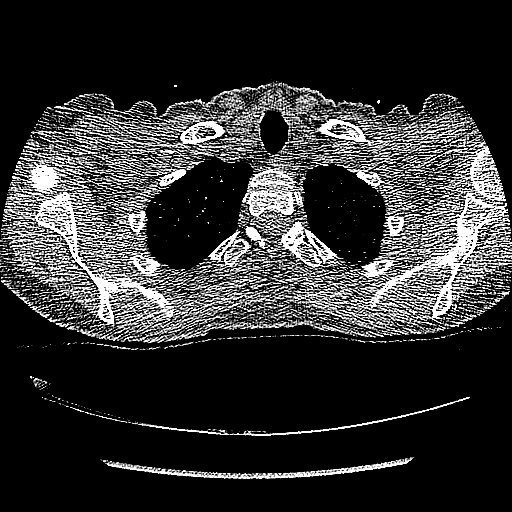
[im 228/257  lung]
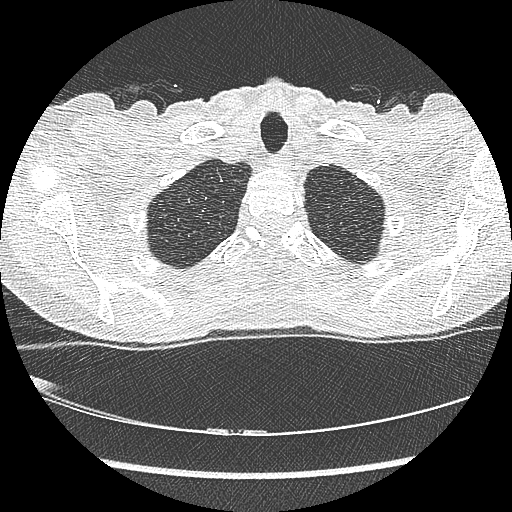
[im 242/257  lung]
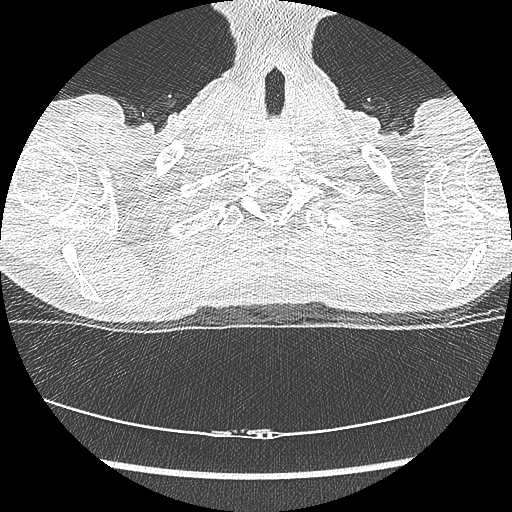

[14 of 40 positions shown; findings below may reference images not displayed]

FINDINGS: Cardiovascular: Heart size is normal. There is no significant
pericardial fluid, thickening or pericardial calcification. There is
aortic atherosclerosis, as well as atherosclerosis of the great
vessels of the mediastinum and the coronary arteries, including
calcified atherosclerotic plaque in the left anterior descending and
right coronary arteries.

Mediastinum/Nodes: No pathologically enlarged mediastinal or hilar
lymph nodes. Please note that accurate exclusion of hilar adenopathy
is limited on noncontrast CT scans. Multiple densely calcified right
hilar and right paratracheal lymph nodes are again noted related old
granulomatous disease. Esophagus is unremarkable in appearance. No
axillary lymphadenopathy.

Lungs/Pleura: Multiple small calcified noncalcified pulmonary
nodules are noted throughout the lungs bilaterally. The largest
noncalcified nodule is in the posterior aspect of the right lower
lobe (axial image 121 of series 3) with a volume derived mean
diameter of only 4.2 mm. No other larger more suspicious appearing
pulmonary nodules or masses are noted. No acute consolidative
airspace disease. No pleural effusions. With very mild centrilobular
and paraseptal emphysema.

Upper Abdomen: Unremarkable.

Musculoskeletal: Focal area of architectural distortion in the deep
soft tissues of the left breast where there are several surgical
clips and/or radioactive seeds, compatible with patient's history of
prior lumpectomy. In this region there is a 2.8 x 1.2 cm
low-attenuation collection which likely represents a small seroma
(axial image 38 of series 2). There are no aggressive appearing
lytic or blastic lesions noted in the visualized portions of the
skeleton.
IMPRESSION: 1. Lung-RADS 2S, benign appearance or behavior. Continue annual
screening with low-dose chest CT without contrast in 12 months.
2. The "S" modifier above refers to potentially clinically
significant non lung cancer related findings. Specifically, there is
aortic atherosclerosis, in addition to 2 vessel coronary artery
disease. Please note that although the presence of coronary artery
calcium documents the presence of coronary artery disease, the
severity of this disease and any potential stenosis cannot be
assessed on this non-gated CT examination. Assessment for potential
risk factor modification, dietary therapy or pharmacologic therapy
may be warranted, if clinically indicated.
3. Very mild centrilobular and paraseptal emphysema.
4. Additional incidental findings, as above.

Aortic Atherosclerosis ([id number]-ZV4.4) and Emphysema ([id number]).

## 2017-12-29 ENCOUNTER — Telehealth: Payer: Self-pay | Admitting: Radiation Oncology

## 2017-12-29 ENCOUNTER — Ambulatory Visit
Admission: RE | Admit: 2017-12-29 | Payer: PRIVATE HEALTH INSURANCE | Source: Ambulatory Visit | Admitting: Radiation Oncology

## 2017-12-29 NOTE — Telephone Encounter (Signed)
LM for the patient to see if she was doing alright. She had to cancel her appt due to a GI bug per nursing. I encouraged her to call back if she needed medication for nausea or needed help with managing her symptoms.

## 2018-03-01 ENCOUNTER — Telehealth: Payer: Self-pay

## 2018-03-01 NOTE — Telephone Encounter (Signed)
Spoke with patient reminding of SCP visit on 12/10/17 at 2 pm with LC.  Patient says she will come to appt.

## 2018-03-10 ENCOUNTER — Telehealth: Payer: Self-pay | Admitting: Adult Health

## 2018-03-10 ENCOUNTER — Encounter: Payer: Self-pay | Admitting: Adult Health

## 2018-03-10 ENCOUNTER — Inpatient Hospital Stay: Payer: PRIVATE HEALTH INSURANCE | Attending: Adult Health | Admitting: Adult Health

## 2018-03-10 VITALS — BP 108/68 | HR 94 | Temp 98.5°F | Resp 18 | Ht 67.0 in | Wt 129.4 lb

## 2018-03-10 DIAGNOSIS — Z923 Personal history of irradiation: Secondary | ICD-10-CM | POA: Diagnosis not present

## 2018-03-10 DIAGNOSIS — M8580 Other specified disorders of bone density and structure, unspecified site: Secondary | ICD-10-CM

## 2018-03-10 DIAGNOSIS — D0512 Intraductal carcinoma in situ of left breast: Secondary | ICD-10-CM | POA: Diagnosis not present

## 2018-03-10 DIAGNOSIS — E038 Other specified hypothyroidism: Secondary | ICD-10-CM | POA: Diagnosis not present

## 2018-03-10 DIAGNOSIS — Z87891 Personal history of nicotine dependence: Secondary | ICD-10-CM

## 2018-03-10 NOTE — Progress Notes (Signed)
CLINIC:  Survivorship   REASON FOR VISIT:  Routine follow-up post-treatment for a recent history of breast cancer.  BRIEF ONCOLOGIC HISTORY:    Ductal carcinoma in situ (DCIS) of left breast   08/04/2017 Initial Diagnosis    Screening detected left breast calcifications 1.2 x 1.1 x 0.7 cm, stereotactic biopsy intermediate grade DCIS with calcifications, ALH, ER 100% PR 80%, Tis N0 stage 0      08/22/2017 Genetic Testing     Hereditary Gene Panel offered by [facility] includes sequencing and/or deletion duplication testing of the following 46 genes: APC, ATM, AXIN2, BARD1, BMPR1A, BRCA1, BRCA2, BRIP1, CDH1, CDKN2A (p14ARF), CDKN2A (p16INK4a), CHEK2, CTNNA1, DICER1, EPCAM (Deletion/duplication testing only), GREM1 (promoter region deletion/duplication testing only), KIT, MEN1, MLH1, MSH2, MSH3, MSH6, MUTYH, NBN, NF1, NHTL1, PALB2, PDGFRA, PMS2, POLD1, POLE, PTEN, RAD50, RAD51C, RAD51D, SDHB, SDHC, SDHD, SMAD4, SMARCA4. STK11, TP53, TSC1, TSC2, and VHL.  The following genes were evaluated for sequence changes only: SDHA and HOXB13 c.251G>A variant only.  Results: Negative, No pathogenic mutations identified in the 46 genes analyzed.  The date of this test report is 08/22/2017.      08/30/2017 Surgery    Left lumpectomy: DCIS grade 3, 1.5 cm, margins negative, ER 100%, PR 80%, Tis Nx stage 0      09/28/2017 - 11/15/2017 Radiation Therapy    Adjuvant radiation therapy      12/2017 -  Anti-estrogen oral therapy    Tamoxifen daily       INTERVAL HISTORY:  [name] presents to the [facility] today for our initial meeting to review her survivorship care plan detailing her treatment course for breast cancer, as well as monitoring long-term side effects of that treatment, education regarding health maintenance, screening, and overall wellness and health promotion.     Overall, [name] reports feeling quite well.  She was taking Tamoxifen daily however after a few weeks  developed leg cramps and difficulty lifting things and numbness in her arms.  She stopped Tamoxifen and the problems resolved.  She underwent TAH in 2006.  She has one ovary left.  She has decided however she doesn't want to take any further anti estrogen therapy.      REVIEW OF SYSTEMS:  Review of Systems  Constitutional: Negative for appetite change, chills, fatigue, fever and unexpected weight change.  HENT:   Negative for hearing loss.   Eyes: Negative for eye problems and icterus.  Respiratory: Negative for chest tightness, cough and shortness of breath.   Cardiovascular: Negative for chest pain, leg swelling and palpitations.  Gastrointestinal: Negative for abdominal distention, abdominal pain, constipation, diarrhea, nausea and vomiting.  Endocrine: Negative for hot flashes.  Skin: Negative for itching and rash.  Neurological: Negative for dizziness, extremity weakness, headaches and numbness.  Hematological: Negative for adenopathy. Does not bruise/bleed easily.  Psychiatric/Behavioral: Negative for depression. The patient is not nervous/anxious.    Breast: Denies any new nodularity, masses, tenderness, nipple changes, or nipple discharge.      ONCOLOGY TREATMENT TEAM:  1. Surgeon:  Dr. Donne Hazel at [facility] 2. Medical Oncologist: Dr. Lindi Adie  3. Radiation Oncologist: Dr. Lisbeth Renshaw    PAST MEDICAL/SURGICAL HISTORY:  Past Medical History:  Diagnosis Date  . ADD (attention deficit disorder)    Adult  . Cancer ([place])    Left Breast  . Graves disease    1999  . Osteopenia   . Postablative hypothyroidism    after radioactive iodine therapy   Past Surgical History:  Procedure Laterality Date  . APPENDECTOMY    . BREAST LUMPECTOMY WITH RADIOACTIVE SEED LOCALIZATION Left 08/30/2017   Procedure: BREAST LUMPECTOMY WITH RADIOACTIVE SEED LOCALIZATION;  Surgeon: Rolm Bookbinder, MD;  Location: [place];  Service: General;  Laterality: Left;  . increase lipoprotein     . MINOR IRRIGATION AND DEBRIDEMENT OF WOUND     Major irrigation   pelvic crest   1982  . ORIF TIBIA PLATEAU     2015  . osteoprosis     s/p fracture L tibial plateau  . VAGINAL HYSTERECTOMY       ALLERGIES:  No Known Allergies   CURRENT MEDICATIONS:  Outpatient Encounter Medications as of 03/10/2018  Medication Sig Note  . Cholecalciferol (VITAMIN D3) 1000 units CAPS Take 1,000 Units by mouth daily.    . hyaluronate sodium (RADIAPLEXRX) GEL Apply 1 application topically 2 (two) times daily.   Marland Kitchen ibandronate (BONIVA) 150 MG tablet Take 150 mg by mouth every 30 (thirty) days. Take in the morning with a full glass of water, on an empty stomach, and do not take anything else by mouth or lie down for the next 30 min. 08/23/2017: Last:08/11/17 *Patient will switch to Fosamax next month*  . ibuprofen (ADVIL,MOTRIN) 200 MG tablet Take 400 mg by mouth every 8 (eight) hours as needed (for pain.).   Marland Kitchen levothyroxine (SYNTHROID, LEVOTHROID) 75 MCG tablet Take 75 mcg by mouth daily before breakfast.   . non-metallic deodorant (ALRA) MISC Apply 1 application topically daily as needed.   . tamoxifen (NOLVADEX) 20 MG tablet Take 1 tablet (20 mg total) by mouth daily.    No facility-administered encounter medications on file as of 03/10/2018.      ONCOLOGIC FAMILY HISTORY:  Family History  Problem Relation Age of Onset  . Breast cancer Maternal Aunt        d.62 diagnosed in her 75s.  . Dementia Mother   . Stroke Father 78  . Brain cancer Maternal Grandmother 70       d.70s  . Lung cancer Paternal Uncle        d.60s history of smoking  . Heart attack Paternal Grandfather 69       d.72  . Breast cancer Cousin        Daughter of maternal aunt with breast cancer. Diagnosed in her 24s.     GENETIC COUNSELING/TESTING: See above  SOCIAL HISTORY:  Social History   Socioeconomic History  . Marital status: Married    Spouse name: Not on file  . Number of children: Not on file  . Years of  education: Not on file  . Highest education level: Not on file  Occupational History  . Not on file  Social Needs  . Financial resource strain: Not on file  . Food insecurity:    Worry: Not on file    Inability: Not on file  . Transportation needs:    Medical: Not on file    Non-medical: Not on file  Tobacco Use  . Smoking status: Former Research scientist (life sciences)  . Smokeless tobacco: Never Used  . Tobacco comment: quit 1988  Substance and Sexual Activity  . Alcohol use: Yes    Alcohol/week: 0.0 oz  . Drug use: No  . Sexual activity: Not on file  Lifestyle  . Physical activity:    Days per week: Not on file    Minutes per session: Not on file  . Stress: Not on file  Relationships  . Social connections:  Talks on phone: Not on file    Gets together: Not on file    Attends religious service: Not on file    Active member of club or organization: Not on file    Attends meetings of clubs or organizations: Not on file    Relationship status: Not on file  . Intimate partner violence:    Fear of current or ex partner: Not on file    Emotionally abused: Not on file    Physically abused: Not on file    Forced sexual activity: Not on file  Other Topics Concern  . Not on file  Social History Narrative   Tobacco use cigarettes: Former smoker, Quit in year  1988, Tobacco history  Last updated 09/24/2014 ,no smoking. Alcohol : yes wine. 2+ per week Caffeine : yes 2-3 serving per day.no Recreational drug use . No exercise ,nothing structured. Occupation: unemployed [employer] Status married , Children 4 seat belt use..yes          PHYSICAL EXAMINATION:  Vital Signs:   Vitals:   03/10/18 1335  BP: 108/68  Pulse: 94  Resp: 18  Temp: 98.5 F (36.9 C)  SpO2: 99%   Filed Weights   03/10/18 1335  Weight: 129 lb 6.4 oz (58.7 kg)   General: Well-nourished, well-appearing female in no acute distress.  She is unaccompanied today.   HEENT: Head is normocephalic.  Pupils equal and  reactive to light. Conjunctivae clear without exudate.  Sclerae anicteric. Oral mucosa is pink, moist.  Oropharynx is pink without lesions or erythema.  Lymph: No cervical, supraclavicular, or infraclavicular lymphadenopathy noted on palpation.  Cardiovascular: Regular rate and rhythm.Marland Kitchen Respiratory: Clear to auscultation bilaterally. Chest expansion symmetric; breathing non-labored.  Breast: left breast s/p lumpectomy, nipple everted, mild amt of scar tissue present, no nodules, masses, skin or nipple lesions, right breast without nodules, masses, skin or nipple changes, nipple is inverted GI: Abdomen soft and round; non-tender, non-distended. Bowel sounds normoactive.  GU: Deferred.  Neuro: No focal deficits. Steady gait.  Psych: Mood and affect normal and appropriate for situation.  Extremities: No edema. MSK: No focal spinal tenderness to palpation.  Full range of motion in bilateral upper extremities Skin: Warm and dry.  LABORATORY DATA:  None for this visit.  DIAGNOSTIC IMAGING:  None for this visit.      ASSESSMENT AND PLAN:  Ms.. [name] is a pleasant 57 y.o. female with Stage 0 left breast DCIS, ER+/PR+, diagnosed in 07/2017, treated with lumpectomy, adjuvant radiation therapy, and unable to tolerate anti estrogen therapy.  She presents to the Survivorship Clinic for our initial meeting and routine follow-up post-completion of treatment for breast cancer.    1. Stage 0 left breast cancer:  [name] is continuing to recover from definitive treatment for breast cancer. She will follow-up with her medical oncologist, Dr. Lindi Adie in 6 months with history and physical exam per surveillance protocol. She was unable to tolerate Tamoxifen and has stopped. She has decided not to purse Anastrozole or another aromatase inhibitor which is reasonable.  She knows her risk of developing a new breast cancer is slightly increased with this option.  Today, a comprehensive survivorship care plan  and treatment summary was reviewed with the patient today detailing her breast cancer diagnosis, treatment course, potential late/long-term effects of treatment, appropriate follow-up care with recommendations for the future, and patient education resources.  A copy of this summary, along with a letter will be sent to the patient's  primary care provider via mail/fax/In Basket message after today's visit.    2. Bone health:  Given [name]'s age/history of breast cancer, she is at risk for bone demineralization.  She underwent DEXA in 07/2017 and was consistent with osteopenia.  She is taking Actonel and tolerating it well.  We will continue to defer to her PCP regarding bone density testing and management.  She was given education on specific activities to promote bone health.  3. Cancer screening:  Due to [name]'s history and her age, she should receive screening for skin cancers, colon cancer, and gynecologic cancers.  The information and recommendations are listed on the patient's comprehensive care plan/treatment summary and were reviewed in detail with the patient.    4. Health maintenance and wellness promotion: [name] was encouraged to consume 5-7 servings of fruits and vegetables per day. We reviewed the "Nutrition Rainbow" handout, as well as the handout "Take Control of Your Health and Reduce Your Cancer Risk" from the [facility].  She was also encouraged to engage in moderate to vigorous exercise for 30 minutes per day most days of the week. We discussed the [employer] fitness program, which is designed for cancer survivors to help them become more physically fit after cancer treatments.  She was instructed to limit her alcohol consumption and continue to abstain from tobacco use.     5. Support services/counseling: It is not uncommon for this period of the patient's cancer care trajectory to be one of many emotions and stressors.  We discussed an opportunity  for her to participate in the next session of [facility] ("Finding Your New Normal") support group series designed for patients after they have completed treatment.   [name] was encouraged to take advantage of our many other support services programs, support groups, and/or counseling in coping with her new life as a cancer survivor after completing anti-cancer treatment.  She was offered support today through active listening and expressive supportive counseling.  She was given information regarding our available services and encouraged to contact me with any questions or for help enrolling in any of our support group/programs.    Dispo:   -Return to cancer center for follow up with Dr. Lindi Adie in 6 months -Mammogram due in 07/2018 -Follow up with Dr. Donne Hazel in 1 year -She is welcome to return back to the Survivorship Clinic at any time; no additional follow-up needed at this time.  -Consider referral back to survivorship as a long-term survivor for continued surveillance  A total of (30) minutes of face-to-face time was spent with this patient with greater than 50% of that time in counseling and care-coordination.   Gardenia Phlegm, [place] [phone]   Note: PRIMARY CARE PROVIDER Kelton Pillar, [place] [phone]

## 2018-03-10 NOTE — Telephone Encounter (Signed)
Gave patient AVS and calendar of upcoming September appointments.  °

## 2018-07-14 ENCOUNTER — Ambulatory Visit
Admission: RE | Admit: 2018-07-14 | Discharge: 2018-07-14 | Disposition: A | Discharge status: Home / Self Care | Payer: PRIVATE HEALTH INSURANCE | Source: Ambulatory Visit | Attending: Family Medicine | Admitting: Family Medicine

## 2018-07-14 ENCOUNTER — Other Ambulatory Visit: Payer: Self-pay | Admitting: Family Medicine

## 2018-07-14 DIAGNOSIS — M79604 Pain in right leg: Secondary | ICD-10-CM

## 2018-07-18 ENCOUNTER — Other Ambulatory Visit: Payer: Self-pay | Admitting: Family Medicine

## 2018-07-18 DIAGNOSIS — E785 Hyperlipidemia, unspecified: Secondary | ICD-10-CM

## 2018-07-18 DIAGNOSIS — Z8249 Family history of ischemic heart disease and other diseases of the circulatory system: Secondary | ICD-10-CM

## 2018-07-19 ENCOUNTER — Other Ambulatory Visit: Payer: Self-pay

## 2018-07-19 DIAGNOSIS — R609 Edema, unspecified: Secondary | ICD-10-CM

## 2018-07-20 ENCOUNTER — Ambulatory Visit (HOSPITAL_COMMUNITY)
Admission: RE | Admit: 2018-07-20 | Discharge: 2018-07-20 | Disposition: A | Discharge status: Home / Self Care | Payer: PRIVATE HEALTH INSURANCE | Source: Ambulatory Visit | Attending: Vascular Surgery | Admitting: Vascular Surgery

## 2018-07-20 DIAGNOSIS — I83891 Varicose veins of right lower extremities with other complications: Secondary | ICD-10-CM | POA: Insufficient documentation

## 2018-07-20 DIAGNOSIS — R609 Edema, unspecified: Secondary | ICD-10-CM | POA: Insufficient documentation

## 2018-07-24 ENCOUNTER — Encounter: Payer: Self-pay | Admitting: Vascular Surgery

## 2018-07-24 ENCOUNTER — Ambulatory Visit (INDEPENDENT_AMBULATORY_CARE_PROVIDER_SITE_OTHER): Payer: PRIVATE HEALTH INSURANCE | Admitting: Vascular Surgery

## 2018-07-24 VITALS — BP 103/68 | HR 85 | Resp 18 | Ht 67.0 in | Wt 125.0 lb

## 2018-07-24 DIAGNOSIS — I872 Venous insufficiency (chronic) (peripheral): Secondary | ICD-10-CM | POA: Diagnosis not present

## 2018-07-24 NOTE — Progress Notes (Signed)
Patient name: [name] MRN: [id number] DOB: Apr 24, 1961 Sex: female   REASON FOR CONSULT:    Acute swelling of the right foot and leg.  The consult is requested by Dr. Kelton Pillar.  HPI:   [name] is a pleasant 57 y.o. female, who is referred with right lower extremity swelling.  Previous DVT and showed no evidence of DVT.  I reviewed the records from the referring office.  The patient had developed acute onset of swelling in the right foot and leg several weeks ago.  Venous duplex scan was negative.  The patient was referred for a reflux study which was done in office on 07/20/2018.  This did show evidence of chronic venous insufficiency on the right.  On my history, the patient developed a fairly acute onset of swelling in the right lower leg about a month ago.  This has been gradually progressive.  Duplex scan showed no evidence of DVT.  She has had no previous history of DVT or phlebitis.  She did state that she had a similar episode years ago that ultimately resolved.  She is had no previous surgery on the right lower extremity or radiation therapy.  She is on her feet quite a bit.  She has elevated her legs some without much benefit.  Past Medical History:  Diagnosis Date  . ADD (attention deficit disorder)    Adult  . Cancer ([place])    Left Breast  . Graves disease    1999  . Osteopenia   . Postablative hypothyroidism    after radioactive iodine therapy    Family History  Problem Relation Age of Onset  . Breast cancer Maternal Aunt        d.62 diagnosed in her 12s.  . Dementia Mother   . Stroke Father 67  . Brain cancer Maternal Grandmother 70       d.70s  . Lung cancer Paternal Uncle        d.60s history of smoking  . Heart attack Paternal Grandfather 75       d.72  . Breast cancer Cousin        Daughter of maternal aunt with breast cancer. Diagnosed in her 42s.    SOCIAL HISTORY: She is not a smoker. Social History   Socioeconomic  History  . Marital status: Married    Spouse name: Not on file  . Number of children: Not on file  . Years of education: Not on file  . Highest education level: Not on file  Occupational History  . Not on file  Social Needs  . Financial resource strain: Not on file  . Food insecurity:    Worry: Not on file    Inability: Not on file  . Transportation needs:    Medical: Not on file    Non-medical: Not on file  Tobacco Use  . Smoking status: Former Research scientist (life sciences)  . Smokeless tobacco: Never Used  . Tobacco comment: quit 1988  Substance and Sexual Activity  . Alcohol use: Yes    Alcohol/week: 0.0 standard drinks  . Drug use: No  . Sexual activity: Not on file  Lifestyle  . Physical activity:    Days per week: Not on file    Minutes per session: Not on file  . Stress: Not on file  Relationships  . Social connections:    Talks on phone: Not on file    Gets together: Not on file    Attends religious service: Not  on file    Active member of club or organization: Not on file    Attends meetings of clubs or organizations: Not on file    Relationship status: Not on file  . Intimate partner violence:    Fear of current or ex partner: Not on file    Emotionally abused: Not on file    Physically abused: Not on file    Forced sexual activity: Not on file  Other Topics Concern  . Not on file  Social History Narrative   Tobacco use cigarettes: Former smoker, Quit in year  1988, Tobacco history  Last updated 09/24/2014 ,no smoking. Alcohol : yes wine. 2+ per week Caffeine : yes 2-3 serving per day.no Recreational drug use . No exercise ,nothing structured. Occupation: unemployed [employer] Status married , Children 4 seat belt use..yes         No Known Allergies  Current Outpatient Medications  Medication Sig Dispense Refill  . Cholecalciferol (VITAMIN D3) 1000 units CAPS Take 1,000 Units by mouth daily.     . hyaluronate sodium (RADIAPLEXRX) GEL Apply 1 application  topically 2 (two) times daily.    Marland Kitchen levothyroxine (SYNTHROID, LEVOTHROID) 75 MCG tablet Take 75 mcg by mouth daily before breakfast.    . ibandronate (BONIVA) 150 MG tablet Take 150 mg by mouth every 30 (thirty) days. Take in the morning with a full glass of water, on an empty stomach, and do not take anything else by mouth or lie down for the next 30 min.    Marland Kitchen ibuprofen (ADVIL,MOTRIN) 200 MG tablet Take 400 mg by mouth every 8 (eight) hours as needed (for pain.).    Marland Kitchen non-metallic deodorant (ALRA) MISC Apply 1 application topically daily as needed.    . risedronate (ACTONEL) 150 MG tablet TK 1 T PO 1 TIME A MONTH  3  . tamoxifen (NOLVADEX) 20 MG tablet Take 1 tablet (20 mg total) by mouth daily. (Patient not taking: Reported on 07/24/2018) 90 tablet 3   No current facility-administered medications for this visit.     REVIEW OF SYSTEMS:  [X]  denotes positive finding, [ ]  denotes negative finding Cardiac  Comments:  Chest pain or chest pressure:    Shortness of breath upon exertion:    Short of breath when lying flat:    Irregular heart rhythm:        Vascular    Pain in calf, thigh, or hip brought on by ambulation:    Pain in feet at night that wakes you up from your sleep:     Blood clot in your veins:    Leg swelling:  x  right lower leg      Pulmonary    Oxygen at home:    Productive cough:     Wheezing:         Neurologic    Sudden weakness in arms or legs:     Sudden numbness in arms or legs:     Sudden onset of difficulty speaking or slurred speech:    Temporary loss of vision in one eye:     Problems with dizziness:         Gastrointestinal    Blood in stool:     Vomited blood:         Genitourinary    Burning when urinating:     Blood in urine:        Psychiatric    Major depression:  Hematologic    Bleeding problems:    Problems with blood clotting too easily:        Skin    Rashes or ulcers:        Constitutional    Fever or chills:      PHYSICAL EXAM:   Vitals:   07/24/18 1605  BP: 103/68  Pulse: 85  Resp: 18  SpO2: 97%  Weight: 125 lb (56.7 kg)  Height: 5\' 7"  (1.702 m)   GENERAL: The patient is a well-nourished female, in no acute distress. The vital signs are documented above. CARDIAC: There is a regular rate and rhythm.  VASCULAR:  ARTERIAL: I do not detect carotid bruits.  She has palpable femoral and pedal pulses bilaterally. VENOUS: She has right lower extremity swelling involving mostly the ankle and foot.  She has some small spider veins bilaterally.  She has no significant hyperpigmentation. PULMONARY: There is good air exchange bilaterally without wheezing or rales. ABDOMEN: Soft and non-tender with normal pitched bowel sounds.  MUSCULOSKELETAL: There are no major deformities or cyanosis. NEUROLOGIC: No focal weakness or paresthesias are detected. SKIN: There are no ulcers or rashes noted. PSYCHIATRIC: The patient has a normal affect.  DATA:    VENOUS DUPLEX: I have reviewed the venous duplex scan that was done on 07/14/2018.  The patient had been having swelling and pain for several weeks.  This study showed no evidence of DVT of the right lower extremity.  VENOUS REFLUX STUDY: I did review her venous reflux study was done on 07/20/2018.  This study showed no evidence of DVT of the right lower extremity.  There was no superficial venous thrombosis.  There was a deep venous reflux involving the common femoral vein.  There was superficial venous reflux involving the great saphenous vein at the knee and the origin of the small saphenous vein.  These veins were not especially dilated.  MEDICAL ISSUES:   CHRONIC VENOUS INSUFFICIENCY: I think most likely the etiology of her leg swelling is related to her chronic venous insufficiency.  She has reflux in the deep system and also the superficial system.  We have discussed the importance of intermittent leg elevation the proper positioning for this.  In addition I  written her prescription for knee-high compression stockings with a mild gradient.  I have encouraged her to avoid prolonged sitting and standing.  I discussed the importance of exercise and also water aerobics which I think is very helpful for patients with venous insufficiency.  If her swelling does not improve with this then perhaps further work-up is needed.  However given the findings on her reflux study I think this is most likely the source of her swelling.  I will be happy to see her back at any time.  Deitra Mayo [facility] [phone]

## 2018-07-25 ENCOUNTER — Ambulatory Visit
Admission: RE | Admit: 2018-07-25 | Discharge: 2018-07-25 | Disposition: A | Discharge status: Home / Self Care | Payer: PRIVATE HEALTH INSURANCE | Source: Ambulatory Visit | Attending: Family Medicine | Admitting: Family Medicine

## 2018-07-25 DIAGNOSIS — E785 Hyperlipidemia, unspecified: Secondary | ICD-10-CM

## 2018-07-25 DIAGNOSIS — Z8249 Family history of ischemic heart disease and other diseases of the circulatory system: Secondary | ICD-10-CM

## 2018-08-03 ENCOUNTER — Ambulatory Visit
Admission: RE | Admit: 2018-08-03 | Discharge: 2018-08-03 | Disposition: A | Discharge status: Home / Self Care | Payer: PRIVATE HEALTH INSURANCE | Source: Ambulatory Visit | Attending: Adult Health | Admitting: Adult Health

## 2018-08-03 DIAGNOSIS — D0512 Intraductal carcinoma in situ of left breast: Secondary | ICD-10-CM

## 2018-08-10 ENCOUNTER — Encounter: Payer: PRIVATE HEALTH INSURANCE | Admitting: Vascular Surgery

## 2018-08-16 ENCOUNTER — Telehealth: Payer: Self-pay | Admitting: Radiation Oncology

## 2018-08-16 NOTE — Telephone Encounter (Signed)
I called the patient to discuss her interest in sharing her story in a news outlet. She will contact us if she would like to proceed.

## 2018-08-21 ENCOUNTER — Ambulatory Visit (INDEPENDENT_AMBULATORY_CARE_PROVIDER_SITE_OTHER): Payer: PRIVATE HEALTH INSURANCE | Admitting: Internal Medicine

## 2018-08-21 ENCOUNTER — Telehealth: Payer: Self-pay | Admitting: Hematology and Oncology

## 2018-08-21 ENCOUNTER — Encounter: Payer: Self-pay | Admitting: Internal Medicine

## 2018-08-21 VITALS — BP 118/64 | HR 83 | Ht 67.0 in | Wt 126.4 lb

## 2018-08-21 DIAGNOSIS — E785 Hyperlipidemia, unspecified: Secondary | ICD-10-CM

## 2018-08-21 DIAGNOSIS — R931 Abnormal findings on diagnostic imaging of heart and coronary circulation: Secondary | ICD-10-CM | POA: Diagnosis not present

## 2018-08-21 NOTE — Progress Notes (Signed)
Cardiology Office Note   Date:  08/21/2018   ID:  [name], DOB 1961/06/30, MRN [id number]  PCP:  Kelton Pillar, MD  Cardiologist:   Dorris Carnes, MD   Pt self referred for calcium score    History of Present Illness: [name] is a 57 y.o. female with a history of venous insufficiency, breast CA in past (L Breast   S/p XRT and chemo) Calcium score CT done with FHx of heart dz  Ca score was 127 (93 %ile)    The pt denies CP   Breathing is OK  She exercises regularly   No change in her ability to do things  Being eval for R LE swelling   USN negatie        Current Meds  Medication Sig  . Cholecalciferol (VITAMIN D3) 1000 units CAPS Take 1,000 Units by mouth daily.   Marland Kitchen levothyroxine (SYNTHROID, LEVOTHROID) 75 MCG tablet Take 75 mcg by mouth daily before breakfast.  . risedronate (ACTONEL) 150 MG tablet TK 1 T PO 1 TIME A MONTH     Allergies:   Patient has no known allergies.   Past Medical History:  Diagnosis Date  . ADD (attention deficit disorder)    Adult  . Cancer ([facility])    Left Breast  . Graves disease    1999  . Osteopenia   . Postablative hypothyroidism    after radioactive iodine therapy    Past Surgical History:  Procedure Laterality Date  . APPENDECTOMY    . BREAST LUMPECTOMY Left   . BREAST LUMPECTOMY WITH RADIOACTIVE SEED LOCALIZATION Left 08/30/2017   Procedure: BREAST LUMPECTOMY WITH RADIOACTIVE SEED LOCALIZATION;  Surgeon: Rolm Bookbinder, MD;  Location: [facility];  Service: General;  Laterality: Left;  . increase lipoprotein    . MINOR IRRIGATION AND DEBRIDEMENT OF WOUND     Major irrigation   pelvic crest   1982  . ORIF TIBIA PLATEAU     2015  . osteoprosis     s/p fracture L tibial plateau  . VAGINAL HYSTERECTOMY       Social History:  The patient  reports that she has quit smoking. She has never used smokeless tobacco. She reports that she drinks alcohol. She reports that she does not use drugs.   Family History:   The patient's family history includes Brain cancer (age of onset: 44) in her maternal grandmother; Breast cancer in her cousin and maternal aunt; Dementia in her mother; Heart attack (age of onset: 14) in her paternal grandfather; Lung cancer in her paternal uncle; Stroke (age of onset: 70) in her father.    ROS:  Please see the history of present illness. All other systems are reviewed and  Negative to the above problem except as noted.    PHYSICAL EXAM: VS:  BP 118/64   Pulse 83   Ht 5\' 7"  (1.702 m)   Wt 126 lb 6.4 oz (57.3 kg)   BMI 19.80 kg/m   GEN: Well nourished, well developed, in no acute distress  HEENT: normal  Neck: no JVD, carotid bruits, or masses Cardiac: RRR; no murmurs, rubs, or gallops, 1+ RLE edema  Respiratory:  clear to auscultation bilaterally, normal work of breathing GI: soft, nontender, nondistended, + BS  No hepatomegaly  MS: no deformity Moving all extremities   Skin: warm and dry, no rash Neuro:  Strength and sensation are intact Psych: euthymic mood, full affect   EKG:  EKG is ordered today.  SR  83 bpm     Lipid Panel No results found for: CHOL, TRIG, HDL, CHOLHDL, VLDL, LDLCALC, LDLDIRECT    Wt Readings from Last 3 Encounters:  08/21/18 126 lb 6.4 oz (57.3 kg)  07/24/18 125 lb (56.7 kg)  03/10/18 129 lb 6.4 oz (58.7 kg)      ASSESSMENT AND PLAN:  1   CAD   PT with coronary calcifications   I have reviewed scoring with pt    SHe does not have symtpoms    I would recomm a GXT to r/o ischemia She questions doing CT angiogram   AGain, without objective symptoms or abnormal GXT I would not push toward CT angiogram She should start ec ASA 81 mg  2   HL   Pt with signif hyperlipidemi   Last LDL 155  She has moved to a vegan diet.     I would recomm lowering with statin like Crestor   She is reluctant to use statin   Wants to try diet/exercise. Will get lipomed panel she had done with Dr Laurann Montana.    Current medicines are reviewed at length with  the patient today.  The patient does not have concerns regarding medicines.  Signed, Dorris Carnes, MD  08/21/2018 8:18 AM    [address], [place], [place]  [address] Phone: [phone]; Fax: [phone]

## 2018-08-21 NOTE — Patient Instructions (Addendum)
Your physician recommends that you continue on your current medications as directed. Please refer to the Current Medication list given to you today.  Your physician has requested that you have an exercise tolerance test. For further information please visit [email or web address]. Please also follow instruction sheet, as given.  Follow up with your physician will depend on test results.  Addendum: contacted PCP, requested lipomed panel results be faxed to Dr. Harrington Challenger at [phone].

## 2018-08-21 NOTE — Telephone Encounter (Signed)
VG call day 9/26 - moved f/u to AM. Left message. Schedule mailed.

## 2018-08-23 ENCOUNTER — Other Ambulatory Visit: Payer: Self-pay | Admitting: Internal Medicine

## 2018-08-23 ENCOUNTER — Other Ambulatory Visit: Payer: Self-pay | Admitting: Family Medicine

## 2018-08-23 DIAGNOSIS — M7989 Other specified soft tissue disorders: Secondary | ICD-10-CM

## 2018-08-28 ENCOUNTER — Ambulatory Visit (INDEPENDENT_AMBULATORY_CARE_PROVIDER_SITE_OTHER): Payer: PRIVATE HEALTH INSURANCE

## 2018-08-28 ENCOUNTER — Other Ambulatory Visit: Payer: Self-pay | Admitting: Internal Medicine

## 2018-08-28 DIAGNOSIS — E785 Hyperlipidemia, unspecified: Secondary | ICD-10-CM | POA: Diagnosis not present

## 2018-08-28 DIAGNOSIS — R931 Abnormal findings on diagnostic imaging of heart and coronary circulation: Secondary | ICD-10-CM

## 2018-08-28 DIAGNOSIS — R079 Chest pain, unspecified: Secondary | ICD-10-CM

## 2018-08-28 LAB — EXERCISE TOLERANCE TEST
Estimated workload: 13.7 METS
Exercise duration (min): 11 min
Exercise duration (sec): 59 s
MPHR: 161 {beats}/min
Peak HR: 148 {beats}/min
Percent HR: 92 %
RPE: 15
Rest HR: 81 {beats}/min

## 2018-08-29 ENCOUNTER — Ambulatory Visit
Admission: RE | Admit: 2018-08-29 | Discharge: 2018-08-29 | Disposition: A | Discharge status: Home / Self Care | Payer: PRIVATE HEALTH INSURANCE | Source: Ambulatory Visit | Attending: Family Medicine | Admitting: Family Medicine

## 2018-08-29 ENCOUNTER — Other Ambulatory Visit: Payer: PRIVATE HEALTH INSURANCE

## 2018-08-29 DIAGNOSIS — M7989 Other specified soft tissue disorders: Secondary | ICD-10-CM

## 2018-08-29 MED ORDER — GADOBENATE DIMEGLUMINE 529 MG/ML IV SOLN
11.0000 mL | Freq: Once | INTRAVENOUS | Status: AC | PRN
  Administered 2018-08-29: 11 mL via INTRAVENOUS

## 2018-08-31 ENCOUNTER — Inpatient Hospital Stay: Payer: PRIVATE HEALTH INSURANCE | Attending: Hematology and Oncology | Admitting: Hematology and Oncology

## 2018-08-31 ENCOUNTER — Telehealth: Payer: Self-pay | Admitting: Hematology and Oncology

## 2018-08-31 DIAGNOSIS — Z86000 Personal history of in-situ neoplasm of breast: Secondary | ICD-10-CM | POA: Insufficient documentation

## 2018-08-31 DIAGNOSIS — Z923 Personal history of irradiation: Secondary | ICD-10-CM | POA: Insufficient documentation

## 2018-08-31 DIAGNOSIS — R600 Localized edema: Secondary | ICD-10-CM | POA: Diagnosis not present

## 2018-08-31 DIAGNOSIS — D0512 Intraductal carcinoma in situ of left breast: Secondary | ICD-10-CM

## 2018-08-31 NOTE — Progress Notes (Signed)
Patient Care Team: Kelton Pillar, MD as PCP - General (Family Medicine) Fay Records, MD as PCP - Cardiology (Cardiology) Rolm Bookbinder, MD as Consulting Physician (General Surgery) Nicholas Lose, MD as Consulting Physician (Hematology and Oncology) Kyung Rudd, MD as Consulting Physician (Radiation Oncology) Delice Bison Charlestine Massed, NP as Nurse Practitioner (Hematology and Oncology)  DIAGNOSIS:  Encounter Diagnosis  Name Primary?  . Ductal carcinoma in situ (DCIS) of left breast     SUMMARY OF ONCOLOGIC HISTORY:   Ductal carcinoma in situ (DCIS) of left breast   08/04/2017 Initial Diagnosis    Screening detected left breast calcifications 1.2 x 1.1 x 0.7 cm, stereotactic biopsy intermediate grade DCIS with calcifications, ALH, ER 100% PR 80%, Tis N0 stage 0    08/22/2017 Genetic Testing     Hereditary Gene Panel offered by [facility] includes sequencing and/or deletion duplication testing of the following 46 genes: APC, ATM, AXIN2, BARD1, BMPR1A, BRCA1, BRCA2, BRIP1, CDH1, CDKN2A (p14ARF), CDKN2A (p16INK4a), CHEK2, CTNNA1, DICER1, EPCAM (Deletion/duplication testing only), GREM1 (promoter region deletion/duplication testing only), KIT, MEN1, MLH1, MSH2, MSH3, MSH6, MUTYH, NBN, NF1, NHTL1, PALB2, PDGFRA, PMS2, POLD1, POLE, PTEN, RAD50, RAD51C, RAD51D, SDHB, SDHC, SDHD, SMAD4, SMARCA4. STK11, TP53, TSC1, TSC2, and VHL.  The following genes were evaluated for sequence changes only: SDHA and HOXB13 c.251G>A variant only.  Results: Negative, No pathogenic mutations identified in the 46 genes analyzed.  The date of this test report is 08/22/2017.    08/30/2017 Surgery    Left lumpectomy: DCIS grade 3, 1.5 cm, margins negative, ER 100%, PR 80%, Tis Nx stage 0    09/28/2017 - 11/15/2017 Radiation Therapy    Adjuvant radiation therapy    12/2017 - 01/2018 Anti-estrogen oral therapy    Tamoxifen daily: Discontinued because of side effects     CHIEF COMPLIANT: Follow-up on tamoxifen  therapy  INTERVAL HISTORY: [name] is a 57 year old with above-mentioned history of left breast DCIS who took tamoxifen for about 6 weeks and then decided to discontinued because of adverse effects.  She was experiencing lots of arthralgias and myalgias.  The symptoms went away after she stopped it.  Denies any lumps or nodules in the breast.  Her complaint recently has been swelling of the right leg which was fully evaluated by her primary care physician and determined that it could be related to venous insufficiency.  She is using elastic compression stocking to prevent that.  REVIEW OF SYSTEMS:   Constitutional: Denies fevers, chills or abnormal weight loss Eyes: Denies blurriness of vision Ears, nose, mouth, throat, and face: Denies mucositis or sore throat Respiratory: Denies cough, dyspnea or wheezes Cardiovascular: Denies palpitation, chest discomfort Gastrointestinal:  Denies nausea, heartburn or change in bowel habits Skin: Denies abnormal skin rashes Lymphatics: Denies new lymphadenopathy or easy bruising Neurological:Denies numbness, tingling or new weaknesses Behavioral/Psych: Mood is stable, no new changes  Extremities: No lower extremity edema Breast:  denies any pain or lumps or nodules in either breasts All other systems were reviewed with the patient and are negative.  I have reviewed the past medical history, past surgical history, social history and family history with the patient and they are unchanged from previous note.  ALLERGIES:  has No Known Allergies.  MEDICATIONS:  Current Outpatient Medications  Medication Sig Dispense Refill  . Cholecalciferol (VITAMIN D3) 1000 units CAPS Take 1,000 Units by mouth daily.     Marland Kitchen levothyroxine (SYNTHROID, LEVOTHROID) 75 MCG tablet Take 75 mcg by mouth daily before breakfast.    .  risedronate (ACTONEL) 150 MG tablet TK 1 T PO 1 TIME A MONTH  3   No current facility-administered medications for this visit.      PHYSICAL EXAMINATION: ECOG PERFORMANCE STATUS: 1 - Symptomatic but completely ambulatory  Vitals:   08/31/18 1050  BP: 108/64  Pulse: 91  Resp: 20  Temp: 98.1 F (36.7 C)  SpO2: 99%   Filed Weights   08/31/18 1050  Weight: 124 lb 8 oz (56.5 kg)    GENERAL:alert, no distress and comfortable SKIN: skin color, texture, turgor are normal, no rashes or significant lesions EYES: normal, Conjunctiva are pink and non-injected, sclera clear OROPHARYNX:no exudate, no erythema and lips, buccal mucosa, and tongue normal  NECK: supple, thyroid normal size, non-tender, without nodularity LYMPH:  no palpable lymphadenopathy in the cervical, axillary or inguinal LUNGS: clear to auscultation and percussion with normal breathing effort HEART: regular rate & rhythm and no murmurs and no lower extremity edema ABDOMEN:abdomen soft, non-tender and normal bowel sounds MUSCULOSKELETAL:no cyanosis of digits and no clubbing  NEURO: alert & oriented x 3 with fluent speech, no focal motor/sensory deficits EXTREMITIES: No lower extremity edema BREAST: No palpable masses or nodules in either right or left breasts. No palpable axillary supraclavicular or infraclavicular adenopathy no breast tenderness or nipple discharge. (exam performed in the presence of a chaperone)  LABORATORY DATA:  I have reviewed the data as listed CMP Latest Ref Rng & Units 08/25/2017 08/10/2017  Glucose 65 - 99 mg/dL 88 87  BUN 6 - 20 mg/dL <5(L) 6.4(L)  Creatinine 0.44 - 1.00 mg/dL 0.58 0.7  Sodium 135 - 145 mmol/L 136 136  Potassium 3.5 - 5.1 mmol/L 4.4 4.3  Chloride 101 - 111 mmol/L 102 -  CO2 22 - 32 mmol/L 24 25  Calcium 8.9 - 10.3 mg/dL 10.1 10.2  Total Protein 6.4 - 8.3 g/dL - 7.3  Total Bilirubin 0.20 - 1.20 mg/dL - 0.43  Alkaline Phos 40 - 150 U/L - 43  AST 5 - 34 U/L - 21  ALT 0 - 55 U/L - 14    Lab Results  Component Value Date   WBC 5.0 08/25/2017   HGB 12.4 08/25/2017   HCT 36.9 08/25/2017   MCV 95.8  08/25/2017   PLT 299 08/25/2017   NEUTROABS 2.1 08/10/2017    ASSESSMENT & PLAN:  Ductal carcinoma in situ (DCIS) of left breast 08/30/2017:Left lumpectomy: DCIS grade 3, 1.5 cm, margins negative, ER 100%, PR 80%, Tis Nx stage 0 Adjuvant radiation therapy 09/28/2017-11/15/2017  Prognosis: Based upon [facility], patient's 10 year risk of recurrence with tamoxifen is at 4%  Recommendation: Adjuvant tamoxifen 20 mg daily times 5 years started January 2019-discontinued February 2019 Breast cancer surveillance: 1.  Breast exam 11/17/2017: Benign 2. Mammogram 08/03/2018: No evidence of malignancy breast density category C Leg swelling for no clear etiology in the right leg only: Using elastic compression stockings.  Return to clinic in 1 year for follow-up  No orders of the defined types were placed in this encounter.  The patient has a good understanding of the overall plan. she agrees with it. she will call with any problems that may develop before the next visit here.   Harriette Ohara, MD 08/31/18

## 2018-08-31 NOTE — Telephone Encounter (Signed)
Gave patient avs.  Patient is my chart active and did not need calendar printed.

## 2018-08-31 NOTE — Assessment & Plan Note (Signed)
08/30/2017:Left lumpectomy: DCIS grade 3, 1.5 cm, margins negative, ER 100%, PR 80%, Tis Nx stage 0 Adjuvant radiation therapy 09/28/2017-11/15/2017  Prognosis: Based upon [facility], patient's 10 year risk of recurrence with tamoxifen is at 4%  Recommendation: Adjuvant tamoxifen 20 mg daily times 5 years started January 2019  Tamoxifen toxicities:  Breast cancer surveillance: 1.  Breast exam 11/17/2017: Benign 2. Mammogram 08/03/2018: No evidence of malignancy breast density category C  Return to clinic in 1 year for follow-up

## 2018-12-02 ENCOUNTER — Emergency Department (HOSPITAL_COMMUNITY): Payer: PRIVATE HEALTH INSURANCE

## 2018-12-02 ENCOUNTER — Encounter (HOSPITAL_COMMUNITY): Payer: Self-pay

## 2018-12-02 ENCOUNTER — Emergency Department (HOSPITAL_COMMUNITY)
Admission: EM | Admit: 2018-12-02 | Discharge: 2018-12-02 | Disposition: A | Discharge status: Home / Self Care | Payer: PRIVATE HEALTH INSURANCE | Attending: Emergency Medicine | Admitting: Emergency Medicine

## 2018-12-02 ENCOUNTER — Other Ambulatory Visit: Payer: Self-pay

## 2018-12-02 DIAGNOSIS — S4981XA Other specified injuries of right shoulder and upper arm, initial encounter: Secondary | ICD-10-CM | POA: Insufficient documentation

## 2018-12-02 DIAGNOSIS — Y999 Unspecified external cause status: Secondary | ICD-10-CM | POA: Insufficient documentation

## 2018-12-02 DIAGNOSIS — Y929 Unspecified place or not applicable: Secondary | ICD-10-CM | POA: Insufficient documentation

## 2018-12-02 DIAGNOSIS — E0500 Thyrotoxicosis with diffuse goiter without thyrotoxic crisis or storm: Secondary | ICD-10-CM | POA: Diagnosis not present

## 2018-12-02 DIAGNOSIS — Z87891 Personal history of nicotine dependence: Secondary | ICD-10-CM | POA: Diagnosis not present

## 2018-12-02 DIAGNOSIS — Y939 Activity, unspecified: Secondary | ICD-10-CM | POA: Diagnosis not present

## 2018-12-02 DIAGNOSIS — S01511A Laceration without foreign body of lip, initial encounter: Secondary | ICD-10-CM | POA: Diagnosis present

## 2018-12-02 DIAGNOSIS — W0110XA Fall on same level from slipping, tripping and stumbling with subsequent striking against unspecified object, initial encounter: Secondary | ICD-10-CM | POA: Insufficient documentation

## 2018-12-02 DIAGNOSIS — M79601 Pain in right arm: Secondary | ICD-10-CM

## 2018-12-02 MED ORDER — LIDOCAINE HCL (PF) 1 % IJ SOLN
5.0000 mL | Freq: Once | INTRAMUSCULAR | Status: AC
  Administered 2018-12-02: 5 mL via INTRADERMAL
  Filled 2018-12-02: qty 30

## 2018-12-02 MED ORDER — CHLORHEXIDINE GLUCONATE 0.12 % MT SOLN: 15.0000 mL | Freq: Two times a day (BID) | OROMUCOSAL | 0 refills | Status: DC

## 2018-12-02 MED ORDER — HYDROCODONE-ACETAMINOPHEN 5-325 MG PO TABS
1.0000 | ORAL_TABLET | Freq: Once | ORAL | Status: AC
  Administered 2018-12-02: 1 via ORAL
  Filled 2018-12-02: qty 1

## 2018-12-02 NOTE — ED Triage Notes (Addendum)
Patient states she tripped on a sewer cover and fell on the street. Patient has a lower lip laceration, left hand pain, right arm pain and right knee pain. Patient denies blood thinners.

## 2018-12-02 NOTE — Discharge Instructions (Signed)
Use mouthwash as directed.  You can use it 2-3 times a day.  Sure you are keeping the wound clean and dry.  In about 4 to 5 days, pull on the tail ends of the sutures.  If they easily come out, remove them.  If they have some resistance, wait about 1-2 more days and attempt again.    As we discussed, your x-ray today showed no evidence of fracture or dislocation.  If you are still having worsening pain in about 1 to 2 weeks, follow-up with referred orthopedic doctor for further evaluation.  Follow the RICE (Rest, Ice, Compression, Elevation) protocol as directed.   You can take Tylenol or Ibuprofen as directed for pain. You can alternate Tylenol and Ibuprofen every 4 hours. If you take Tylenol at 1pm, then you can take Ibuprofen at 5pm. Then you can take Tylenol again at 9pm.   Return the emergency department for any worsening pain, numbness/weakness of the arm, redness or swelling of the lip, fevers or any other worsening or concerning symptoms.

## 2018-12-02 NOTE — ED Notes (Signed)
patien tstates she does not want x-rays of the left hand or right knee.

## 2018-12-02 NOTE — ED Provider Notes (Signed)
[facility] DEPT Provider Note   CSN: [id number] Arrival date & time: 12/02/18  1706     History   Chief Complaint Chief Complaint  Patient presents with  . Fall  . Lip Laceration  . Arm Pain  . Knee Pain  . Hand Pain    HPI [name] is a 57 y.o. female past medical history of ADD, Graves', osteopenia who presents for evaluation of right arm pain, left hand, right knee pain and lip laceration that occurred after mechanical fall that occurred earlier this evening.  Patient reports she was walking and tripped over a sewer that was sticking up, causing her to fall forward.  Patient reports that she sustained a lower lip laceration.  Additionally, she states that she landed on her right arm and has had pain since then.  Patient states she did not hit her head or have any LOC.  She denies any preceding chest pain or dizziness.  Patient reports she landed on both knees but has been able to ambulate and bear weight without any difficulty.  Patient states she is not currently on blood thinners.  Patient states her tetanus is up-to-date.  The history is provided by the patient.    Past Medical History:  Diagnosis Date  . ADD (attention deficit disorder)    Adult  . Cancer ([place])    Left Breast  . Graves disease    1999  . Osteopenia   . Postablative hypothyroidism    after radioactive iodine therapy    Patient Active Problem List   Diagnosis Date Noted  . Genetic testing 08/22/2017  . Ductal carcinoma in situ (DCIS) of left breast 08/09/2017    Past Surgical History:  Procedure Laterality Date  . APPENDECTOMY    . BREAST LUMPECTOMY Left   . BREAST LUMPECTOMY WITH RADIOACTIVE SEED LOCALIZATION Left 08/30/2017   Procedure: BREAST LUMPECTOMY WITH RADIOACTIVE SEED LOCALIZATION;  Surgeon: Rolm Bookbinder, MD;  Location: [facility];  Service: General;  Laterality: Left;  . increase lipoprotein    . MINOR IRRIGATION AND DEBRIDEMENT OF WOUND       Major irrigation   pelvic crest   1982  . ORIF TIBIA PLATEAU     2015  . osteoprosis     s/p fracture L tibial plateau  . VAGINAL HYSTERECTOMY       OB History   No obstetric history on file.      Home Medications    Prior to Admission medications   Medication Sig Start Date End Date Taking? Authorizing Provider  chlorhexidine (PERIDEX) 0.12 % solution Use as directed 15 mLs in the mouth or throat 2 (two) times daily. 12/02/18   Volanda Napoleon, PA-C  Cholecalciferol (VITAMIN D3) 1000 units CAPS Take 1,000 Units by mouth daily.     [provider]  levothyroxine (SYNTHROID, LEVOTHROID) 75 MCG tablet Take 75 mcg by mouth daily before breakfast.    [provider]  risedronate (ACTONEL) 150 MG tablet TK 1 T PO 1 TIME A MONTH 07/07/18   [provider]    Family History Family History  Problem Relation Age of Onset  . Breast cancer Maternal Aunt        d.62 diagnosed in her 57s.  . Dementia Mother   . Stroke Father 37  . Brain cancer Maternal Grandmother 70       d.70s  . Lung cancer Paternal Uncle        d.60s history of smoking  .  Heart attack Paternal Grandfather 42       d.72  . Breast cancer Cousin        Daughter of maternal aunt with breast cancer. Diagnosed in her 52s.    Social History Social History   Tobacco Use  . Smoking status: Former Research scientist (life sciences)  . Smokeless tobacco: Never Used  . Tobacco comment: quit 1988  Substance Use Topics  . Alcohol use: Yes    Alcohol/week: 0.0 standard drinks  . Drug use: No     Allergies   Patient has no known allergies.   Review of Systems Review of Systems  Respiratory: Negative for chest tightness.   Musculoskeletal:       Right arm pain  Skin: Positive for wound.  Neurological: Negative for dizziness, weakness and numbness.  All other systems reviewed and are negative.    Physical Exam Updated Vital Signs BP 109/76 (BP Location: Left Arm)   Pulse 87   Temp 97.8 F (36.6 C)  (Oral)   Resp 16   Ht 5\' 7"  (1.702 m)   Wt 54 kg   SpO2 98%   BMI 18.64 kg/m   Physical Exam Vitals signs and nursing note reviewed.  Constitutional:      Appearance: She is well-developed.  HENT:     Head: Normocephalic and atraumatic.     Comments: Dentition appears intact.    Mouth/Throat:     Mouth: Mucous membranes are moist. Injury present.     Dentition: Normal dentition.     Pharynx: Oropharynx is clear.      Comments: Airways patent, phonation is intact. Eyes:     General: No scleral icterus.       Right eye: No discharge.        Left eye: No discharge.     Conjunctiva/sclera: Conjunctivae normal.  Cardiovascular:     Pulses:          Radial pulses are 2+ on the right side and 2+ on the left side.  Pulmonary:     Effort: Pulmonary effort is normal.  Musculoskeletal:     Comments: Diffuse tenderness noted to the forearm.  There is some overlying soft tissue swelling and ecchymosis. No deformity or crepitus noted.  Flexion/extension of right elbow intact but with some subjective reports of pain.  No bony tenderness noted to right wrist.  No bony tenderness noted to right shoulder.  No tenderness noted to left upper extremity.  Full range of motion of left upper extremity with any difficulty.  No tenderness noted to anterior aspect of right knee.  No deformity or crepitus noted.  Small overlying abrasion.  No bony tenderness noted to anterior aspect of left knee.  No deformity or crepitus noted.  Skin:    General: Skin is warm and dry.  Neurological:     Mental Status: She is alert.     Comments: Equal grip strength bilaterally.  Sensation intact along major nerve distributions of BUE  Psychiatric:        Speech: Speech normal.        Behavior: Behavior normal.      ED Treatments / Results  Labs (all labs ordered are listed, but only abnormal results are displayed) Labs Reviewed - No data to display  EKG None  Radiology Dg Elbow Complete Right  Result  Date: 12/02/2018 CLINICAL DATA:  Pain secondary to a fall today. EXAM: RIGHT ELBOW - COMPLETE 3+ VIEW COMPARISON:  None. FINDINGS: There is no evidence of fracture, dislocation,  or joint effusion. There is no evidence of significant arthropathy. Small osteophyte of the tip of the coronoid process of the proximal ulna. Slight soft tissue swelling over the posterior aspect of the proximal ulna. IMPRESSION: No acute osseous abnormality. Electronically Signed   By: Lorriane Shire M.D.   On: 12/02/2018 18:54   Dg Forearm Right  Result Date: 12/02/2018 CLINICAL DATA:  Forearm pain secondary to a fall today. EXAM: RIGHT FOREARM - 2 VIEW COMPARISON:  None. FINDINGS: There is no evidence of fracture or other focal bone lesions. Slight soft tissue swelling of the posterior aspect of the proximal ulna. Tiny osteophyte on the coronoid process of the proximal ulna. IMPRESSION: No acute osseous abnormality. Electronically Signed   By: Lorriane Shire M.D.   On: 12/02/2018 18:55    Procedures .Marland KitchenLaceration Repair Date/Time: 12/02/2018 7:35 PM Performed by: Volanda Napoleon, PA-C Authorized by: Volanda Napoleon, PA-C   Consent:    Consent obtained:  Verbal   Consent given by:  Patient   Risks discussed:  Infection, need for additional repair, pain, poor cosmetic result and poor wound healing   Alternatives discussed:  No treatment and delayed treatment Universal protocol:    Procedure explained and questions answered to patient or proxy's satisfaction: yes     Relevant documents present and verified: yes     Test results available and properly labeled: yes     Imaging studies available: yes     Required blood products, implants, devices, and special equipment available: yes     Site/side marked: yes     Immediately prior to procedure, a time out was called: yes     Patient identity confirmed:  Verbally with patient Anesthesia (see MAR for exact dosages):    Anesthesia method:  Local  infiltration Repair type:    Repair type:  Simple Pre-procedure details:    Preparation:  Patient was prepped and draped in usual sterile fashion Exploration:    Hemostasis achieved with:  Direct pressure   Wound exploration: wound explored through full range of motion     Wound extent: foreign bodies/material     Foreign bodies/material:  Gravel Treatment:    Area cleansed with:  Betadine   Amount of cleaning:  Extensive   Irrigation solution:  Sterile saline   Irrigation method:  Syringe   Visualized foreign bodies/material removed: yes   Skin repair:    Repair method:  Sutures   Suture size:  5-0   Wound skin closure material used: Vicryl.   Suture technique:  Simple interrupted   Number of sutures:  3 Approximation:    Approximation:  Close Post-procedure details:    Dressing:  Open (no dressing)   Patient tolerance of procedure:  Tolerated well, no immediate complications Comments:     The wound was anesthetized and irrigated with sterile saline.  There was small amounts of gravel noted in the wound that were removed.  The wound was again irrigated and there was no evidence of foreign bodies.  Wound was clean and extensively irrigated once again before repair.   (including critical care time)  Medications Ordered in ED Medications  lidocaine (PF) (XYLOCAINE) 1 % injection 5 mL (5 mLs Intradermal Given 12/02/18 1932)  HYDROcodone-acetaminophen (NORCO/VICODIN) 5-325 MG per tablet 1 tablet (1 tablet Oral Given 12/02/18 1937)     Initial Impression / Assessment and Plan / ED Course  I have reviewed the triage vital signs and the nursing notes.  Pertinent labs & imaging results  that were available during my care of the patient were reviewed by me and considered in my medical decision making (see chart for details).     57 year old female who presents for evaluation of right arm pain, lip laceration that occurred after mechanical fall earlier this evening.  She reports  she was walking when she tripped over her sewer, causing her to fall forward.  No head injury, LOC. Patient is afebrile, non-toxic appearing, sitting comfortably on examination table. Vital signs reviewed and stable. Patient is neurovascularly intact.  Patient with diffuse tenderness noted to the forearm.  There is some overlying soft tissue swelling and ecchymosis.  No deformity or crepitus noted.  Additionally, patient has a 1 cm laceration noted to the lower lip.  It is proximally midline and extends from the inner portion lip up to the outer portion.  It does not cross the vermilion border.  X-rays of arm ordered at triage.  Discussed with patient regarding treatment options for lip laceration.  Given that it is quite deep and does extend from inside outside, I discussed with her regarding repair here in the ED versus secondary wound closure.  Patient wishes to have it repaired here in the ED. Additionally, patient declined x-rays of her hand, knees.  Laceration repaired as documented above.  There was some small amount of dirt and gravel noted in the wound.  It was thoroughly and extensively irrigated until no foreign bodies were noted.  Patient sent home with Peridex.  Encourage at home supportive care measures.  X-ray reviewed.  No evidence of fracture or dislocation.  Patient placed in sling for support and stabilization.  Encourage patient to follow-up with orthopedics if she does not have any improvement in her pain in approximately1 to 2 weeks. Patient had ample opportunity for questions and discussion. All patient's questions were answered with full understanding. Strict return precautions discussed. Patient expresses understanding and agreement to plan.   Portions of this note were generated with Lobbyist. Dictation errors may occur despite best attempts at proofreading.  Final Clinical Impressions(s) / ED Diagnoses   Final diagnoses:  Lip laceration, initial encounter   Right arm pain    ED Discharge Orders         Ordered    chlorhexidine (PERIDEX) 0.12 % solution  2 times daily     12/02/18 1926           Desma Mcgregor 12/02/18 1946    Little, Wenda Overland, MD 12/03/18 2357

## 2019-01-19 ENCOUNTER — Other Ambulatory Visit: Payer: Self-pay

## 2019-01-19 ENCOUNTER — Encounter (HOSPITAL_COMMUNITY): Payer: Self-pay

## 2019-01-19 DIAGNOSIS — R109 Unspecified abdominal pain: Secondary | ICD-10-CM | POA: Insufficient documentation

## 2019-01-19 DIAGNOSIS — Z5321 Procedure and treatment not carried out due to patient leaving prior to being seen by health care provider: Secondary | ICD-10-CM | POA: Diagnosis not present

## 2019-01-19 LAB — URINALYSIS, ROUTINE W REFLEX MICROSCOPIC
Bacteria, UA: NONE SEEN
Bilirubin Urine: NEGATIVE
Glucose, UA: NEGATIVE mg/dL
Hgb urine dipstick: NEGATIVE
Ketones, ur: NEGATIVE mg/dL
Nitrite: NEGATIVE
Protein, ur: NEGATIVE mg/dL
Specific Gravity, Urine: 1.002 — ABNORMAL LOW (ref 1.005–1.030)
pH: 6 (ref 5.0–8.0)

## 2019-01-19 LAB — COMPREHENSIVE METABOLIC PANEL
ALT: 16 U/L (ref 0–44)
AST: 17 U/L (ref 15–41)
Albumin: 5.1 g/dL — ABNORMAL HIGH (ref 3.5–5.0)
Alkaline Phosphatase: 42 U/L (ref 38–126)
Anion gap: 9 (ref 5–15)
BUN: 7 mg/dL (ref 6–20)
CO2: 25 mmol/L (ref 22–32)
Calcium: 9.8 mg/dL (ref 8.9–10.3)
Chloride: 101 mmol/L (ref 98–111)
Creatinine, Ser: 0.57 mg/dL (ref 0.44–1.00)
GFR calc Af Amer: >60 mL/min (ref >60)
GFR calc non Af Amer: >60 mL/min (ref >60)
Glucose, Bld: 76 mg/dL (ref 70–99)
Potassium: 3.7 mmol/L (ref 3.5–5.1)
Sodium: 135 mmol/L (ref 135–145)
Total Bilirubin: 0.5 mg/dL (ref 0.3–1.2)
Total Protein: 7.3 g/dL (ref 6.5–8.1)

## 2019-01-19 LAB — I-STAT BETA HCG BLOOD, ED (MC, WL, AP ONLY): I-stat hCG, quantitative: <5 m[IU]/mL (ref <5)

## 2019-01-19 LAB — CBC
HCT: 36.7 % (ref 36.0–46.0)
Hemoglobin: 12.1 g/dL (ref 12.0–15.0)
MCH: 33.5 pg (ref 26.0–34.0)
MCHC: 33 g/dL (ref 30.0–36.0)
MCV: 101.7 fL — ABNORMAL HIGH (ref 80.0–100.0)
Platelets: 260 10*3/uL (ref 150–400)
RBC: 3.61 MIL/uL — ABNORMAL LOW (ref 3.87–5.11)
RDW: 12.3 % (ref 11.5–15.5)
WBC: 5.1 10*3/uL (ref 4.0–10.5)
nRBC: 0 % (ref 0.0–0.2)

## 2019-01-19 LAB — LIPASE, BLOOD: Lipase: 38 U/L (ref 11–51)

## 2019-01-19 MED ORDER — SODIUM CHLORIDE 0.9% FLUSH: 3.0000 mL | Freq: Once | INTRAVENOUS | Status: DC

## 2019-01-19 NOTE — ED Triage Notes (Signed)
Pt reports abdominal pain and bloody diarrhea that started earlier today. Pt reports that she now feels a constant urge to go to the bathroom but nothing comes out and is now becoming nauseated.

## 2019-01-19 NOTE — ED Notes (Signed)
Urine culture sent to the lab. 

## 2019-01-20 ENCOUNTER — Emergency Department (HOSPITAL_COMMUNITY)
Admission: EM | Admit: 2019-01-20 | Discharge: 2019-01-20 | Disposition: A | Discharge status: Home / Self Care | Payer: BLUE CROSS/BLUE SHIELD | Attending: Emergency Medicine | Admitting: Emergency Medicine

## 2019-01-20 LAB — TYPE AND SCREEN
ABO/RH(D): O POS
Antibody Screen: NEGATIVE

## 2019-01-20 LAB — ABO/RH: ABO/RH(D): O POS

## 2019-07-02 ENCOUNTER — Other Ambulatory Visit: Payer: Self-pay | Admitting: Family Medicine

## 2019-07-02 DIAGNOSIS — N6320 Unspecified lump in the left breast, unspecified quadrant: Secondary | ICD-10-CM

## 2019-07-06 ENCOUNTER — Other Ambulatory Visit: Payer: Self-pay

## 2019-07-06 ENCOUNTER — Inpatient Hospital Stay: Admission: RE | Admit: 2019-07-06 | Payer: BLUE CROSS/BLUE SHIELD | Source: Ambulatory Visit

## 2019-07-06 ENCOUNTER — Ambulatory Visit
Admission: RE | Admit: 2019-07-06 | Discharge: 2019-07-06 | Disposition: A | Discharge status: Home / Self Care | Payer: BC Managed Care – PPO | Source: Ambulatory Visit | Attending: Family Medicine | Admitting: Family Medicine

## 2019-07-06 ENCOUNTER — Other Ambulatory Visit: Payer: BLUE CROSS/BLUE SHIELD

## 2019-07-06 ENCOUNTER — Other Ambulatory Visit: Payer: Self-pay | Admitting: Family Medicine

## 2019-07-06 DIAGNOSIS — N6320 Unspecified lump in the left breast, unspecified quadrant: Secondary | ICD-10-CM

## 2019-07-06 DIAGNOSIS — N6489 Other specified disorders of breast: Secondary | ICD-10-CM | POA: Diagnosis not present

## 2019-07-06 DIAGNOSIS — R922 Inconclusive mammogram: Secondary | ICD-10-CM | POA: Diagnosis not present

## 2019-08-31 ENCOUNTER — Telehealth: Payer: Self-pay | Admitting: *Deleted

## 2019-08-31 NOTE — Telephone Encounter (Signed)
Received call from pt stating she would like to cancel her yearly follow up with Dr. Lindi Adie on 09/04/2019 stating she would like to come in once covid is no longer a threat.  RN offered to make apt a virtual visit but pt refused and stated she will be in contact with our office when she feels it is safe for her to come in.

## 2019-09-04 ENCOUNTER — Ambulatory Visit: Payer: PRIVATE HEALTH INSURANCE | Admitting: Hematology and Oncology

## 2020-03-12 DIAGNOSIS — B351 Tinea unguium: Secondary | ICD-10-CM | POA: Diagnosis not present

## 2020-03-12 DIAGNOSIS — D485 Neoplasm of uncertain behavior of skin: Secondary | ICD-10-CM | POA: Diagnosis not present

## 2020-03-12 DIAGNOSIS — L821 Other seborrheic keratosis: Secondary | ICD-10-CM | POA: Diagnosis not present

## 2020-03-12 DIAGNOSIS — D225 Melanocytic nevi of trunk: Secondary | ICD-10-CM | POA: Diagnosis not present

## 2020-03-12 DIAGNOSIS — D2239 Melanocytic nevi of other parts of face: Secondary | ICD-10-CM | POA: Diagnosis not present

## 2020-03-12 DIAGNOSIS — I788 Other diseases of capillaries: Secondary | ICD-10-CM | POA: Diagnosis not present

## 2020-03-12 DIAGNOSIS — D2221 Melanocytic nevi of right ear and external auricular canal: Secondary | ICD-10-CM | POA: Diagnosis not present

## 2020-03-17 DIAGNOSIS — Z789 Other specified health status: Secondary | ICD-10-CM | POA: Diagnosis not present

## 2020-03-17 DIAGNOSIS — M810 Age-related osteoporosis without current pathological fracture: Secondary | ICD-10-CM | POA: Diagnosis not present

## 2020-03-17 DIAGNOSIS — E7889 Other lipoprotein metabolism disorders: Secondary | ICD-10-CM | POA: Diagnosis not present

## 2020-03-17 DIAGNOSIS — R7303 Prediabetes: Secondary | ICD-10-CM | POA: Diagnosis not present

## 2020-03-17 DIAGNOSIS — Z0000 Encounter for general adult medical examination without abnormal findings: Secondary | ICD-10-CM | POA: Diagnosis not present

## 2020-03-17 DIAGNOSIS — E890 Postprocedural hypothyroidism: Secondary | ICD-10-CM | POA: Diagnosis not present

## 2020-03-26 ENCOUNTER — Other Ambulatory Visit: Payer: Self-pay | Admitting: Family Medicine

## 2020-03-26 DIAGNOSIS — M810 Age-related osteoporosis without current pathological fracture: Secondary | ICD-10-CM

## 2020-03-28 ENCOUNTER — Other Ambulatory Visit: Payer: Self-pay | Admitting: *Deleted

## 2020-03-28 DIAGNOSIS — Z87891 Personal history of nicotine dependence: Secondary | ICD-10-CM

## 2020-06-02 ENCOUNTER — Ambulatory Visit (INDEPENDENT_AMBULATORY_CARE_PROVIDER_SITE_OTHER): Payer: BC Managed Care – PPO | Admitting: Acute Care

## 2020-06-02 ENCOUNTER — Encounter: Payer: Self-pay | Admitting: Acute Care

## 2020-06-02 ENCOUNTER — Ambulatory Visit: Payer: BC Managed Care – PPO

## 2020-06-02 ENCOUNTER — Ambulatory Visit
Admission: RE | Admit: 2020-06-02 | Discharge: 2020-06-02 | Disposition: A | Discharge status: Home / Self Care | Payer: BC Managed Care – PPO | Source: Ambulatory Visit | Attending: Acute Care | Admitting: Acute Care

## 2020-06-02 ENCOUNTER — Other Ambulatory Visit: Payer: Self-pay

## 2020-06-02 DIAGNOSIS — Z87891 Personal history of nicotine dependence: Secondary | ICD-10-CM | POA: Diagnosis not present

## 2020-06-02 DIAGNOSIS — Z122 Encounter for screening for malignant neoplasm of respiratory organs: Secondary | ICD-10-CM

## 2020-06-02 NOTE — Assessment & Plan Note (Signed)
Shared decision making Visit 06/02/2020 Baseline scan same day

## 2020-06-02 NOTE — Patient Instructions (Signed)
Thank you for participating in the [facility] Screening Program. It was our pleasure to meet you today. We will call you with the results of your scan within the next few days. Your scan will be assigned a Lung RADS category score by the physicians reading the scans.  This Lung RADS score determines follow up scanning.  See below for description of categories, and follow up screening recommendations. We will be in touch to schedule your follow up screening annually or based on recommendations of our providers. We will fax a copy of your scan results to your Primary Care Physician, or the physician who referred you to the program, to ensure they have the results. Please call the office if you have any questions or concerns regarding your scanning experience or results.  Our office number is [phone]. Please speak with Denise Phelps, RN. She is our Lung Cancer Screening RN. If she is unavailable when you call, please have the office staff send her a message. She will return your call at her earliest convenience. Remember, if your scan is normal, we will scan you annually as long as you continue to meet the criteria for the program. (Age 55-77, Current smoker or smoker who has quit within the last 15 years). If you are a smoker, remember, quitting is the single most powerful action that you can take to decrease your risk of lung cancer and other pulmonary, breathing related problems. We know quitting is hard, and we are here to help.  Please let us know if there is anything we can do to help you meet your goal of quitting. If you are a former smoker, congratulations. We are proud of you! Remain smoke free! Remember you can refer friends or family members through the number above.  We will screen them to make sure they meet criteria for the program. Thank you for helping us take better care of you by participating in Lung Screening.  Lung RADS Categories:  Lung RADS 1: no nodules  or definitely non-concerning nodules.  Recommendation is for a repeat annual scan in 12 months.  Lung RADS 2:  nodules that are non-concerning in appearance and behavior with a very low likelihood of becoming an active cancer. Recommendation is for a repeat annual scan in 12 months.  Lung RADS 3: nodules that are probably non-concerning , includes nodules with a low likelihood of becoming an active cancer.  Recommendation is for a 6-month repeat screening scan. Often noted after an upper respiratory illness. We will be in touch to make sure you have no questions, and to schedule your 6-month scan.  Lung RADS 4 A: nodules with concerning findings, recommendation is most often for a follow up scan in 3 months or additional testing based on our provider's assessment of the scan. We will be in touch to make sure you have no questions and to schedule the recommended 3 month follow up scan.  Lung RADS 4 B:  indicates findings that are concerning. We will be in touch with you to schedule additional diagnostic testing based on our provider's  assessment of the scan.   

## 2020-06-02 NOTE — Progress Notes (Signed)
Shared Decision Making Visit Lung Cancer Screening Program [phone])   Eligibility:  Age 59 y.o.  Pack Years Smoking History Calculation 48 pack year smoking history (# packs/per year x # years smoked)  Recent History of coughing up blood  no  Unexplained weight loss? no ( >Than 15 pounds within the last 6 months )  Prior History Lung / other cancer no (Diagnosis within the last 5 years already requiring surveillance chest CT Scans).  Smoking Status Former Smoker  Former Smokers: Years since quit: 10 years  Quit Date: 2011  Visit Components:  Discussion included one or more decision making aids. yes  Discussion included risk/benefits of screening. yes  Discussion included potential follow up diagnostic testing for abnormal scans. yes  Discussion included meaning and risk of over diagnosis. yes  Discussion included meaning and risk of False Positives. yes  Discussion included meaning of total radiation exposure. yes  Counseling Included:  Importance of adherence to annual lung cancer LDCT screening. yes  Impact of comorbidities on ability to participate in the program. yes  Ability and willingness to under diagnostic treatment. yes  Smoking Cessation Counseling:  Current Smokers:   Discussed importance of smoking cessation. yes  Information about tobacco cessation classes and interventions provided to patient. yes  Patient provided with "ticket" for LDCT Scan. yes  Symptomatic Patient. no  Counseling  Diagnosis Code: Tobacco Use Z72.0  Asymptomatic Patient yes  Counseling (Intermediate counseling: > three minutes counseling) V4259  Former Smokers:   Discussed the importance of maintaining cigarette abstinence. yes  Diagnosis Code: Personal History of Nicotine Dependence. D63.875  Information about tobacco cessation classes and interventions provided to patient. Yes  Patient provided with "ticket" for LDCT Scan. yes  Written Order for Lung Cancer  Screening with LDCT placed in Epic. Yes (CT Chest Lung Cancer Screening Low Dose W/O CM) [id number] Z12.2-Screening of respiratory organs Z87.891-Personal history of nicotine dependence   I spent 25 minutes of face to face time with [name] discussing the risks and benefits of lung cancer screening. We viewed a power point together that explained in detail the above noted topics. We took the time to pause the power point at intervals to allow for questions to be asked and answered to ensure understanding. We discussed that she had taken the single most powerful action possible to decrease her risk of developing lung cancer when she quit smoking. I counseled her to remain smoke free, and to contact me if she ever had the desire to smoke again so that I can provide resources and tools to help support the effort to remain smoke free. We discussed the time and location of the scan, and that either  Doroteo Glassman RN or I will call with the results within  24-48 hours of receiving them. She has my card and contact information in the event she needs to speak with me, in addition to a copy of the power point we reviewed as a resource. She verbalized understanding of all of the above and had no further questions upon leaving the office.     I explained to the patient that there has been a high incidence of coronary artery disease noted on these exams. I explained that this is a non-gated exam therefore degree or severity cannot be determined. This patient is  Not on statin therapy. I have asked the patient to follow-up with their PCP regarding any incidental finding of coronary artery disease and management with diet or medication as they feel  is clinically indicated. The patient verbalized understanding of the above and had no further questions.  Pt. Has had a weight loss of 15 pounds, but she has been planning a wedding and feels the stress of  this is the reason.  Carla Spatz,  NP 06/02/2020

## 2020-06-03 ENCOUNTER — Ambulatory Visit
Admission: RE | Admit: 2020-06-03 | Discharge: 2020-06-03 | Disposition: A | Discharge status: Home / Self Care | Payer: BC Managed Care – PPO | Source: Ambulatory Visit | Attending: Family Medicine | Admitting: Family Medicine

## 2020-06-03 ENCOUNTER — Other Ambulatory Visit: Payer: Self-pay

## 2020-06-03 DIAGNOSIS — M8589 Other specified disorders of bone density and structure, multiple sites: Secondary | ICD-10-CM | POA: Diagnosis not present

## 2020-06-03 DIAGNOSIS — M810 Age-related osteoporosis without current pathological fracture: Secondary | ICD-10-CM

## 2020-06-03 DIAGNOSIS — Z780 Asymptomatic menopausal state: Secondary | ICD-10-CM | POA: Diagnosis not present

## 2020-06-05 ENCOUNTER — Other Ambulatory Visit: Payer: Self-pay | Admitting: *Deleted

## 2020-06-05 DIAGNOSIS — Z87891 Personal history of nicotine dependence: Secondary | ICD-10-CM

## 2020-06-05 NOTE — Progress Notes (Signed)
I have called the patient with the results of her low-dose CT.  I have explained that the scan was read as a lung RADS 3, probably benign findings, short-term follow-up in 6 months is recommended with repeat low-dose CT without contrast. The patient verbalized understanding.We did discuss the notation of mild emphysema and suggestive underlying COPD.  I explained that COPD can only be diagnosed with pulmonary function testing.  I asked her if she had any interest in being referred to a pulmonologist to have this evaluated.  She is going to discuss this with her husband, and then she will call the office at [phone] let us know [phone] to let us know if she is interested in a pulmonary consult to evaluate for COPD.  Langley Gauss, please fax results to PCP and schedule patient's 10-month follow-up low-dose CT for the end of December 2021.  Thanks so much

## 2020-06-10 ENCOUNTER — Institutional Professional Consult (permissible substitution): Payer: BC Managed Care – PPO | Admitting: Pulmonary Disease

## 2020-07-21 ENCOUNTER — Institutional Professional Consult (permissible substitution): Payer: BC Managed Care – PPO | Admitting: Internal Medicine

## 2020-09-16 ENCOUNTER — Other Ambulatory Visit: Payer: Self-pay | Admitting: Family Medicine

## 2020-09-16 DIAGNOSIS — Z9889 Other specified postprocedural states: Secondary | ICD-10-CM

## 2020-10-01 ENCOUNTER — Other Ambulatory Visit: Payer: Self-pay

## 2020-10-01 ENCOUNTER — Ambulatory Visit
Admission: RE | Admit: 2020-10-01 | Discharge: 2020-10-01 | Disposition: A | Discharge status: Home / Self Care | Payer: BC Managed Care – PPO | Source: Ambulatory Visit | Attending: Family Medicine | Admitting: Family Medicine

## 2020-10-01 DIAGNOSIS — R922 Inconclusive mammogram: Secondary | ICD-10-CM | POA: Diagnosis not present

## 2020-10-01 DIAGNOSIS — Z853 Personal history of malignant neoplasm of breast: Secondary | ICD-10-CM | POA: Diagnosis not present

## 2020-10-01 DIAGNOSIS — Z9889 Other specified postprocedural states: Secondary | ICD-10-CM

## 2020-11-11 ENCOUNTER — Encounter: Payer: Self-pay | Admitting: Radiation Oncology

## 2020-12-02 ENCOUNTER — Ambulatory Visit
Admission: RE | Admit: 2020-12-02 | Discharge: 2020-12-02 | Disposition: A | Discharge status: Home / Self Care | Payer: BC Managed Care – PPO | Source: Ambulatory Visit | Attending: Acute Care | Admitting: Acute Care

## 2020-12-02 DIAGNOSIS — R918 Other nonspecific abnormal finding of lung field: Secondary | ICD-10-CM | POA: Diagnosis not present

## 2020-12-02 DIAGNOSIS — Z87891 Personal history of nicotine dependence: Secondary | ICD-10-CM

## 2020-12-09 ENCOUNTER — Other Ambulatory Visit: Payer: Self-pay | Admitting: *Deleted

## 2020-12-09 DIAGNOSIS — Z87891 Personal history of nicotine dependence: Secondary | ICD-10-CM

## 2020-12-09 NOTE — Progress Notes (Signed)
Please call patient and let them  know their  low dose Ct was read as a Lung RADS 2: nodules that are benign in appearance and behavior with a very low likelihood of becoming a clinically active cancer due to size or lack of growth. Recommendation per radiology is for a repeat LDCT in 12 months. .Please let them  know we will order and schedule their  annual screening scan for 11/2022. Please let them  know there was notation of CAD on their  scan.  Please remind the patient  that this is a non-gated exam therefore degree or severity of disease  cannot be determined. Please have them  follow up with their PCP regarding potential risk factor modification, dietary therapy or pharmacologic therapy if clinically indicated. Pt.  is not  currently on statin therapy. Please place order for annual  screening scan for 11/2022 and fax results to PCP. Thanks so much.  Please have patient follow up with PCP re: finding of aortic atherosclerosis, in addition to left main and 2 vessel coronary artery disease. Thanks so much.

## 2021-03-27 DIAGNOSIS — Z0000 Encounter for general adult medical examination without abnormal findings: Secondary | ICD-10-CM | POA: Diagnosis not present

## 2021-03-27 DIAGNOSIS — E559 Vitamin D deficiency, unspecified: Secondary | ICD-10-CM | POA: Diagnosis not present

## 2021-03-27 DIAGNOSIS — E785 Hyperlipidemia, unspecified: Secondary | ICD-10-CM | POA: Diagnosis not present

## 2021-03-27 DIAGNOSIS — E890 Postprocedural hypothyroidism: Secondary | ICD-10-CM | POA: Diagnosis not present

## 2021-03-27 DIAGNOSIS — R7303 Prediabetes: Secondary | ICD-10-CM | POA: Diagnosis not present

## 2021-06-24 DIAGNOSIS — R109 Unspecified abdominal pain: Secondary | ICD-10-CM | POA: Diagnosis not present

## 2021-06-24 DIAGNOSIS — F419 Anxiety disorder, unspecified: Secondary | ICD-10-CM | POA: Diagnosis not present

## 2021-09-11 ENCOUNTER — Other Ambulatory Visit: Payer: Self-pay | Admitting: Family Medicine

## 2021-09-11 DIAGNOSIS — Z9889 Other specified postprocedural states: Secondary | ICD-10-CM

## 2021-10-01 DIAGNOSIS — H40013 Open angle with borderline findings, low risk, bilateral: Secondary | ICD-10-CM | POA: Diagnosis not present

## 2021-10-01 DIAGNOSIS — H2513 Age-related nuclear cataract, bilateral: Secondary | ICD-10-CM | POA: Diagnosis not present

## 2021-10-01 DIAGNOSIS — H02831 Dermatochalasis of right upper eyelid: Secondary | ICD-10-CM | POA: Diagnosis not present

## 2021-10-01 DIAGNOSIS — H524 Presbyopia: Secondary | ICD-10-CM | POA: Diagnosis not present

## 2021-10-01 DIAGNOSIS — E0500 Thyrotoxicosis with diffuse goiter without thyrotoxic crisis or storm: Secondary | ICD-10-CM | POA: Diagnosis not present

## 2021-10-13 ENCOUNTER — Other Ambulatory Visit: Payer: Self-pay

## 2021-10-13 ENCOUNTER — Ambulatory Visit
Admission: RE | Admit: 2021-10-13 | Discharge: 2021-10-13 | Disposition: A | Discharge status: Home / Self Care | Payer: BC Managed Care – PPO | Source: Ambulatory Visit | Attending: Family Medicine | Admitting: Family Medicine

## 2021-10-13 ENCOUNTER — Other Ambulatory Visit: Payer: Self-pay | Admitting: Family Medicine

## 2021-10-13 DIAGNOSIS — Z9889 Other specified postprocedural states: Secondary | ICD-10-CM

## 2021-10-13 DIAGNOSIS — R922 Inconclusive mammogram: Secondary | ICD-10-CM | POA: Diagnosis not present

## 2021-10-13 DIAGNOSIS — Z853 Personal history of malignant neoplasm of breast: Secondary | ICD-10-CM | POA: Diagnosis not present

## 2021-10-13 DIAGNOSIS — N6489 Other specified disorders of breast: Secondary | ICD-10-CM | POA: Diagnosis not present

## 2022-01-15 DIAGNOSIS — M79671 Pain in right foot: Secondary | ICD-10-CM | POA: Diagnosis not present

## 2022-01-25 DIAGNOSIS — L658 Other specified nonscarring hair loss: Secondary | ICD-10-CM | POA: Diagnosis not present

## 2022-03-29 ENCOUNTER — Other Ambulatory Visit: Payer: Self-pay | Admitting: Family Medicine

## 2022-03-29 DIAGNOSIS — M810 Age-related osteoporosis without current pathological fracture: Secondary | ICD-10-CM

## 2022-03-29 DIAGNOSIS — E890 Postprocedural hypothyroidism: Secondary | ICD-10-CM | POA: Diagnosis not present

## 2022-03-29 DIAGNOSIS — Z0000 Encounter for general adult medical examination without abnormal findings: Secondary | ICD-10-CM | POA: Diagnosis not present

## 2022-03-29 DIAGNOSIS — Z789 Other specified health status: Secondary | ICD-10-CM | POA: Diagnosis not present

## 2022-03-29 DIAGNOSIS — Z9889 Other specified postprocedural states: Secondary | ICD-10-CM

## 2022-03-29 DIAGNOSIS — R7303 Prediabetes: Secondary | ICD-10-CM | POA: Diagnosis not present

## 2022-03-29 DIAGNOSIS — E785 Hyperlipidemia, unspecified: Secondary | ICD-10-CM | POA: Diagnosis not present

## 2022-04-03 ENCOUNTER — Ambulatory Visit
Admission: RE | Admit: 2022-04-03 | Discharge: 2022-04-03 | Disposition: A | Discharge status: Home / Self Care | Payer: BC Managed Care – PPO | Source: Ambulatory Visit | Attending: Physician Assistant | Admitting: Physician Assistant

## 2022-04-03 ENCOUNTER — Other Ambulatory Visit: Payer: Self-pay | Admitting: Physician Assistant

## 2022-04-03 DIAGNOSIS — M545 Low back pain, unspecified: Secondary | ICD-10-CM | POA: Diagnosis not present

## 2022-04-03 DIAGNOSIS — M25551 Pain in right hip: Secondary | ICD-10-CM | POA: Diagnosis not present

## 2022-04-03 DIAGNOSIS — S73191A Other sprain of right hip, initial encounter: Secondary | ICD-10-CM | POA: Diagnosis not present

## 2022-04-05 ENCOUNTER — Other Ambulatory Visit: Payer: Self-pay | Admitting: Orthopedic Surgery

## 2022-04-05 DIAGNOSIS — M84351A Stress fracture, right femur, initial encounter for fracture: Secondary | ICD-10-CM

## 2022-04-06 ENCOUNTER — Other Ambulatory Visit: Payer: BC Managed Care – PPO

## 2022-04-12 DIAGNOSIS — L578 Other skin changes due to chronic exposure to nonionizing radiation: Secondary | ICD-10-CM | POA: Diagnosis not present

## 2022-04-12 DIAGNOSIS — L821 Other seborrheic keratosis: Secondary | ICD-10-CM | POA: Diagnosis not present

## 2022-04-12 DIAGNOSIS — L814 Other melanin hyperpigmentation: Secondary | ICD-10-CM | POA: Diagnosis not present

## 2022-04-14 DIAGNOSIS — M25551 Pain in right hip: Secondary | ICD-10-CM | POA: Diagnosis not present

## 2022-06-10 DIAGNOSIS — Z853 Personal history of malignant neoplasm of breast: Secondary | ICD-10-CM | POA: Diagnosis not present

## 2022-10-27 ENCOUNTER — Other Ambulatory Visit: Payer: Self-pay | Admitting: Internal Medicine

## 2022-10-27 DIAGNOSIS — Z87891 Personal history of nicotine dependence: Secondary | ICD-10-CM

## 2022-11-01 DIAGNOSIS — H524 Presbyopia: Secondary | ICD-10-CM | POA: Diagnosis not present

## 2022-11-01 DIAGNOSIS — E0500 Thyrotoxicosis with diffuse goiter without thyrotoxic crisis or storm: Secondary | ICD-10-CM | POA: Diagnosis not present

## 2022-11-01 DIAGNOSIS — H2513 Age-related nuclear cataract, bilateral: Secondary | ICD-10-CM | POA: Diagnosis not present

## 2022-11-01 DIAGNOSIS — H02831 Dermatochalasis of right upper eyelid: Secondary | ICD-10-CM | POA: Diagnosis not present

## 2022-11-01 DIAGNOSIS — H40013 Open angle with borderline findings, low risk, bilateral: Secondary | ICD-10-CM | POA: Diagnosis not present

## 2022-11-02 ENCOUNTER — Ambulatory Visit
Admission: RE | Admit: 2022-11-02 | Discharge: 2022-11-02 | Disposition: A | Discharge status: Home / Self Care | Payer: BC Managed Care – PPO | Source: Ambulatory Visit | Attending: Family Medicine | Admitting: Family Medicine

## 2022-11-02 DIAGNOSIS — R92333 Mammographic heterogeneous density, bilateral breasts: Secondary | ICD-10-CM | POA: Diagnosis not present

## 2022-11-02 DIAGNOSIS — Z9889 Other specified postprocedural states: Secondary | ICD-10-CM

## 2022-11-02 DIAGNOSIS — M8588 Other specified disorders of bone density and structure, other site: Secondary | ICD-10-CM | POA: Diagnosis not present

## 2022-11-02 DIAGNOSIS — M810 Age-related osteoporosis without current pathological fracture: Secondary | ICD-10-CM | POA: Diagnosis not present

## 2022-11-02 DIAGNOSIS — Z853 Personal history of malignant neoplasm of breast: Secondary | ICD-10-CM | POA: Diagnosis not present

## 2022-11-02 DIAGNOSIS — Z780 Asymptomatic menopausal state: Secondary | ICD-10-CM | POA: Diagnosis not present

## 2022-11-09 DIAGNOSIS — E039 Hypothyroidism, unspecified: Secondary | ICD-10-CM | POA: Diagnosis not present

## 2022-11-09 DIAGNOSIS — Z1211 Encounter for screening for malignant neoplasm of colon: Secondary | ICD-10-CM | POA: Diagnosis not present

## 2022-11-18 ENCOUNTER — Ambulatory Visit: Payer: BC Managed Care – PPO | Attending: Cardiology | Admitting: Cardiology

## 2022-11-18 ENCOUNTER — Encounter: Payer: Self-pay | Admitting: Cardiology

## 2022-11-18 VITALS — BP 112/80 | HR 93 | Ht 67.0 in | Wt 129.0 lb

## 2022-11-18 DIAGNOSIS — I25118 Atherosclerotic heart disease of native coronary artery with other forms of angina pectoris: Secondary | ICD-10-CM

## 2022-11-18 DIAGNOSIS — R0602 Shortness of breath: Secondary | ICD-10-CM

## 2022-11-18 DIAGNOSIS — Z01812 Encounter for preprocedural laboratory examination: Secondary | ICD-10-CM | POA: Diagnosis not present

## 2022-11-18 MED ORDER — METOPROLOL TARTRATE 100 MG PO TABS: 100.0000 mg | ORAL_TABLET | ORAL | 0 refills | Status: DC

## 2022-11-18 NOTE — Patient Instructions (Signed)
Medication Instructions:  The current medical regimen is effective;  continue present plan and medications.  *If you need a refill on your cardiac medications before your next appointment, please call your pharmacy*   Lab Work: Please have blood work today (BMP)  If you have labs (blood work) drawn today and your tests are completely normal, you will receive your results only by: MyChart Message (if you have MyChart) OR A paper copy in the mail If you have any lab test that is abnormal or we need to change your treatment, we will call you to review the results.   Testing/Procedures:   Your cardiac CT will be scheduled at:  [facility] [address] [place], [place] [address] [phone]  Please arrive at the [facility] and Children's Entrance (Entrance C2) of [facility] 30 minutes prior to test start time. You can use the FREE valet parking offered at entrance C (encouraged to control the heart rate for the test)  Proceed to the [facility] Radiology Department (first floor) to check-in and test prep.  All radiology patients and guests should use entrance C2 at [facility], accessed from [facility], even though the hospital's physical address listed is [address].     Please follow these instructions carefully (unless otherwise directed):  On the Night Before the Test: Be sure to Drink plenty of water. Do not consume any caffeinated/decaffeinated beverages or chocolate 12 hours prior to your test. Do not take any antihistamines 12 hours prior to your test.  On the Day of the Test: Drink plenty of water until 1 hour prior to the test. Do not eat any food 1 hour prior to test. You may take your regular medications prior to the test.  Take metoprolol (Lopressor) two hours prior to test. HOLD Furosemide/Hydrochlorothiazide morning of the test.   Hold Adderal 2 days before. FEMALES- please wear underwire-free bra if  available, avoid dresses & tight clothing      After the Test: Drink plenty of water. After receiving IV contrast, you may experience a mild flushed feeling. This is normal. On occasion, you may experience a mild rash up to 24 hours after the test. This is not dangerous. If this occurs, you can take Benadryl 25 mg and increase your fluid intake. If you experience trouble breathing, this can be serious. If it is severe call 911 IMMEDIATELY. If it is mild, please call our office.  We will call to schedule your test 2-4 weeks out understanding that some insurance companies will need an authorization prior to the service being performed.   For non-scheduling related questions, please contact the cardiac imaging nurse navigator should you have any questions/concerns: Marchia Bond, Cardiac Imaging Nurse Navigator Gordy Clement, Cardiac Imaging Nurse Navigator [facility] [facility] Direct Office Dial: [phone]   For scheduling needs, including cancellations and rescheduling, please call [place], [phone].   Follow-Up: At [facility], you and your health needs are our priority.  As part of our continuing mission to provide you with exceptional heart care, we have created designated Provider Care Teams.  These Care Teams include your primary Cardiologist (physician) and Advanced Practice Providers (APPs -  Physician Assistants and Nurse Practitioners) who all work together to provide you with the care you need, when you need it.  We recommend signing up for the patient portal called "MyChart".  Sign up information is provided on this After Visit Summary.  MyChart is used  to connect with patients for Virtual Visits (Telemedicine).  Patients are able to view lab/test results, encounter notes, upcoming appointments, etc.  Non-urgent messages can be sent to your provider as well.   To learn more about what you can do with MyChart, go to [email or web address].     Your next appointment:   Follow up will be based on the results of the above testing.   Important Information About Sugar

## 2022-11-18 NOTE — Progress Notes (Signed)
Cardiology Office Note:    Date:  11/18/2022   ID:  [name], DOB [birth year], MRN [id number]  PCP:  Kelton Pillar, [place] Providers Cardiologist:  Dorris Carnes, MD     Referring MD: Kelton Pillar, MD    History of Present Illness:    [name] is a 61 y.o. female here for Carla evaluation of coronary calcium score.  Requested testing.  She has not been having any chest pain fevers chills nausea vomiting syncope bleeding.  She is a former smoker quit in 2011.  Calcium score CT done with FHx of heart dz Ca score was 127 (93 %ile)   Brother 73 year old MVR and CABG  Faster heart rate. Graves dz treatment. Hiking SOB. More than usual. [place] in March 2023.  Does not desire to take statin therapy.  High Lp(a)  Past Medical History:  Diagnosis Date   ADD (attention deficit disorder)    Adult   Breast cancer ([place]) 2018   Left Breast Cancer   Cancer ([place])    Left Breast   Graves disease    1999   Osteopenia    Personal history of radiation therapy 2018   Left Breast Cancer   Postablative hypothyroidism    after radioactive iodine therapy    Past Surgical History:  Procedure Laterality Date   APPENDECTOMY     BREAST LUMPECTOMY Left 08/30/2017   BREAST LUMPECTOMY WITH RADIOACTIVE SEED LOCALIZATION Left 08/30/2017   Procedure: BREAST LUMPECTOMY WITH RADIOACTIVE SEED LOCALIZATION;  Surgeon: Rolm Bookbinder, MD;  Location: [place];  Service: General;  Laterality: Left;   increase lipoprotein     MINOR IRRIGATION AND DEBRIDEMENT OF WOUND     Major irrigation   pelvic crest   1982   ORIF TIBIA PLATEAU     2015   osteoprosis     s/p fracture L tibial plateau   VAGINAL HYSTERECTOMY      Current Medications: Current Meds  Medication Sig   levothyroxine (SYNTHROID, LEVOTHROID) 75 MCG tablet Take 75 mcg by mouth daily before breakfast.   metoprolol tartrate (LOPRESSOR) 100 MG tablet Take 1 tablet (100 mg total) by mouth as directed. Take  1 tablet 2 hours before your CT scan     Allergies:   Patient has no known allergies.   Social History   Socioeconomic History   Marital status: Married    Spouse name: Not on file   Number of children: Not on file   Years of education: Not on file   Highest education level: Not on file  Occupational History   Not on file  Tobacco Use   Smoking status: Former    Packs/day: 1.50    Years: 32.00    Total pack years: 48.00    Types: Cigarettes    Quit date: 2011    Years since quitting: 12.9   Smokeless tobacco: Never   Tobacco comments:    quit 2011  Vaping Use   Vaping Use: Never used  Substance and Sexual Activity   Alcohol use: Yes    Alcohol/week: 0.0 standard drinks of alcohol   Drug use: No   Sexual activity: Not on file  Other Topics Concern   Not on file  Social History Narrative   Tobacco use cigarettes: Former smoker, Quit in year  1988, Tobacco history  Last updated 09/24/2014 ,no smoking. Alcohol : yes wine. 2+ per week Caffeine : yes 2-3 serving per day.no Recreational drug use .  No exercise ,nothing structured. Occupation: unemployed [employer] Status married , Children 4 seat belt use..yes        Social Determinants of Health   Financial Resource Strain: Not on file  Food Insecurity: Not on file  Transportation Needs: Not on file  Physical Activity: Not on file  Stress: Not on file  Social Connections: Not on file     Family History: Carla patient's family history includes Brain cancer (age of onset: 26) in her maternal grandmother; Breast cancer in her cousin and maternal aunt; Dementia in her mother; Heart attack (age of onset: 53) in her paternal grandfather; Lung cancer in her paternal uncle; Stroke (age of onset: 35) in her father.  ROS:   Please see Carla history of present illness.     All other systems reviewed and are negative.  EKGs/Labs/Other Studies Reviewed:    Carla following studies were reviewed today: Prior office note  EKG reviewed  EKG:  Carla ekg ordered today demonstrates 11/18/2022-normal sinus rhythm 93 no other changes  Recent Labs: No results found for requested labs within last 365 days.  Recent Lipid Panel No results found for: "CHOL", "TRIG", "HDL", "CHOLHDL", "VLDL", "LDLCALC", "LDLDIRECT"        Physical Exam:    VS:  BP 112/80 (BP Location: Left Arm, Patient Position: Sitting, Cuff Size: Normal)   Pulse 93   Ht '5\' 7"'$  (1.702 m)   Wt 129 lb (58.5 kg)   BMI 20.20 kg/m     Wt Readings from Last 3 Encounters:  11/18/22 129 lb (58.5 kg)  12/02/18 119 lb (54 kg)  08/31/18 124 lb 8 oz (56.5 kg)     GEN:  Well nourished, well developed in no acute distress HEENT: Normal NECK: No JVD; No carotid bruits LYMPHATICS: No lymphadenopathy CARDIAC: RRR, no murmurs, rubs, gallops RESPIRATORY:  Clear to auscultation without rales, wheezing or rhonchi  ABDOMEN: Soft, non-tender, non-distended MUSCULOSKELETAL:  No edema; No deformity  SKIN: Warm and dry NEUROLOGIC:  Alert and oriented x 3 PSYCHIATRIC:  Normal affect   ASSESSMENT:    1. Shortness of breath   2. Atherosclerosis of native coronary artery of native heart with other form of angina pectoris ([place])   3. Pre-procedure lab exam    PLAN:    In order of problems listed above:  Shortness of breath/dyspnea on exertion Could be anginal symptoms, we will go ahead and proceed with coronary CT scan.  Metoprolol 100 mg prior to scan.  We will also ask her to stop her Adderall for 2 days prior.  Former smoker.  High LP(a), hyperlipidemia Discussed with her nicotinic acid/niacin trials.  She does not wish to take statins.  Diet, exercise.  Her daughter has a high LP(a) as well.  Mildly elevated heart rate Could be in part secondary to Adderall.  Also noted after Graves' disease ablation and thyroid supplementation.  Her heart rate currently at rest is 93.  When she exercises he can fairly quickly go up to 140.  I am comfortable with her  increasing her heart rate even above 140 listening to her respiratory pattern for guidance.  She is not at a range where a tachycardia induced cardiomyopathy could take place.  I do not strongly feel as though she needs to suppress her tachycardia with medication such as metoprolol or diltiazem long-term.          Medication Adjustments/Labs and Tests Ordered: Current medicines are reviewed at length with Carla patient  today.  Concerns regarding medicines are outlined above.  Orders Placed This Encounter  Procedures   CT CORONARY MORPH W/CTA COR W/SCORE W/CA W/CM &/OR WO/CM   Basic metabolic panel   EKG 28-BTDV   Meds ordered this encounter  Medications   metoprolol tartrate (LOPRESSOR) 100 MG tablet    Sig: Take 1 tablet (100 mg total) by mouth as directed. Take 1 tablet 2 hours before your CT scan    Dispense:  1 tablet    Refill:  0    Patient Instructions  Medication Instructions:  Carla current medical regimen is effective;  continue present plan and medications.  *If you need a refill on your cardiac medications before your next appointment, please call your pharmacy*   Lab Work: Please have blood work today (BMP)  If you have labs (blood work) drawn today and your tests are completely normal, you will receive your results only by: MyChart Message (if you have MyChart) OR A paper copy in Carla mail If you have any lab test that is abnormal or we need to change your treatment, we will call you to review Carla results.   Testing/Procedures:   Your cardiac CT will be scheduled at:  [facility] [address] [place] [address] [phone]  Please arrive at Carla [facility] and Children's Entrance (Entrance C2) of [facility] 30 minutes prior to test start time. You can use Carla FREE valet parking offered at entrance C (encouraged to control Carla heart rate for Carla test)  Proceed to Carla [facility] Radiology Department (first floor) to check-in and  test prep.  All radiology patients and guests should use entrance C2 at [facility], accessed from [facility], even though Carla hospital's physical address listed is [address]     Please follow these instructions carefully (unless otherwise directed):  On Carla Night Before Carla Test: Be sure to Drink plenty of water. Do not consume any caffeinated/decaffeinated beverages or chocolate 12 hours prior to your test. Do not take any antihistamines 12 hours prior to your test.  On Carla Day of Carla Test: Drink plenty of water until 1 hour prior to Carla test. Do not eat any food 1 hour prior to test. You may take your regular medications prior to Carla test.  Take metoprolol (Lopressor) two hours prior to test. HOLD Furosemide/Hydrochlorothiazide morning of Carla test.   Hold Adderal 2 days before. FEMALES- please wear underwire-free bra if available, avoid dresses & tight clothing      After Carla Test: Drink plenty of water. After receiving IV contrast, you may experience a mild flushed feeling. This is normal. On occasion, you may experience a mild rash up to 24 hours after Carla test. This is not dangerous. If this occurs, you can take Benadryl 25 mg and increase your fluid intake. If you experience trouble breathing, this can be serious. If it is severe call 911 IMMEDIATELY. If it is mild, please call our office.  We will call to schedule your test 2-4 weeks out understanding that some insurance companies will need an authorization prior to Carla service being performed.   For non-scheduling related questions, please contact Carla cardiac imaging nurse navigator should you have any questions/concerns: Marchia Bond, Cardiac Imaging Nurse Navigator Gordy Clement, Cardiac Imaging Nurse Navigator [facility] [facility] Direct Office Dial: [phone]   For scheduling needs, including cancellations and rescheduling, please call [place],  [phone].   Follow-Up: At  [facility], you and your health needs are our priority.  As part of our continuing mission to provide you with exceptional heart care, we have created designated Provider Care Teams.  These Care Teams include your primary Cardiologist (physician) and Advanced Practice Providers (APPs -  Physician Assistants and Nurse Practitioners) who all work together to provide you with Carla care you need, when you need it.  We recommend signing up for Carla patient portal called "MyChart".  Sign up information is provided on this After Visit Summary.  MyChart is used to connect with patients for Virtual Visits (Telemedicine).  Patients are able to view lab/test results, encounter notes, upcoming appointments, etc.  Non-urgent messages can be sent to your provider as well.   To learn more about what you can do with MyChart, go to [email or web address].    Your next appointment:   Follow up will be based on Carla results of Carla above testing.   Important Information About Sugar         Signed, Candee Furbish, MD  11/18/2022 4:42 PM    [place]

## 2022-11-19 LAB — BASIC METABOLIC PANEL
BUN/Creatinine Ratio: 15 (ref 12–28)
BUN: 10 mg/dL (ref 8–27)
CO2: 22 mmol/L (ref 20–29)
Calcium: 10.5 mg/dL — ABNORMAL HIGH (ref 8.7–10.3)
Chloride: 99 mmol/L (ref 96–106)
Creatinine, Ser: 0.67 mg/dL (ref 0.57–1.00)
Glucose: 82 mg/dL (ref 70–99)
Potassium: 4.4 mmol/L (ref 3.5–5.2)
Sodium: 137 mmol/L (ref 134–144)
eGFR: 98 mL/min/{1.73_m2} (ref >59)

## 2022-12-02 ENCOUNTER — Telehealth (HOSPITAL_COMMUNITY): Payer: Self-pay | Admitting: *Deleted

## 2022-12-02 NOTE — Telephone Encounter (Signed)
Attempted to call patient regarding upcoming cardiac CT appointment. °Left message on voicemail with name and callback number ° °Caydyn Sprung RN Navigator Cardiac Imaging °[facility] °[phone] Office °[phone] Cell ° °

## 2022-12-03 ENCOUNTER — Encounter (HOSPITAL_COMMUNITY): Payer: Self-pay

## 2022-12-03 ENCOUNTER — Ambulatory Visit (HOSPITAL_COMMUNITY)
Admission: RE | Admit: 2022-12-03 | Discharge: 2022-12-03 | Disposition: A | Discharge status: Home / Self Care | Payer: BC Managed Care – PPO | Source: Ambulatory Visit | Attending: Cardiology | Admitting: Cardiology

## 2022-12-03 DIAGNOSIS — R0602 Shortness of breath: Secondary | ICD-10-CM | POA: Insufficient documentation

## 2022-12-03 DIAGNOSIS — I25118 Atherosclerotic heart disease of native coronary artery with other forms of angina pectoris: Secondary | ICD-10-CM | POA: Diagnosis not present

## 2022-12-03 MED ORDER — NITROGLYCERIN 0.4 MG SL SUBL: 0.8000 mg | SUBLINGUAL_TABLET | Freq: Once | SUBLINGUAL | Status: DC

## 2022-12-03 MED ORDER — SODIUM CHLORIDE 0.9 % IV BOLUS
500.0000 mL | Freq: Once | INTRAVENOUS | Status: AC
  Administered 2022-12-03: 500 mL via INTRAVENOUS

## 2022-12-03 NOTE — Progress Notes (Signed)
1300: After administering 500cc NS bolus to pt per protocol due to pt BP (see Flowsheet for prior BP) and anticipation of receiving SL Nitro for procedure, I notified Dr. Harriet Masson for pt current BP of 95/62.  Dr. Harriet Masson advised to administer x1 SL Nitro for procedure and reassess BP following procedure.  Dr. Harriet Masson advised that if BP was "too low" and clarified "less than 80 systolic", to administer another 500cc NS bolus to pt.  Verbal read-back confirmed with Dr. Harriet Masson with confirmation.   1314: After getting pt to CT scanner, this RN assess pt BP while lying, prior to x1 SL Nitro administration and pt BP was 75/53.  Pt completely asymptomatic at this time and pt voices no complaints or concerns at this time. Dr.Tobb notified of pt BP at that time.  Dr. Harriet Masson advised to cancel procedure at this time and reschedule with Ivabradine.  Pt updated of decision and in agreement with plan of care.   1318: Pt BP reassessed while sitting and noted to be 96/60 at this time with no complaints/concerns voiced by pt at this time.  Pt remains to appear in NAD at this time and asymptomatic.   1329: Notified Merle, RN of need to reschedule.  She thanked me for notified and acknowledged.

## 2022-12-07 ENCOUNTER — Encounter: Payer: Self-pay | Admitting: Cardiology

## 2022-12-07 ENCOUNTER — Ambulatory Visit
Admission: RE | Admit: 2022-12-07 | Discharge: 2022-12-07 | Disposition: A | Discharge status: Home / Self Care | Payer: BC Managed Care – PPO | Source: Ambulatory Visit | Attending: Internal Medicine | Admitting: Internal Medicine

## 2022-12-07 DIAGNOSIS — Z87891 Personal history of nicotine dependence: Secondary | ICD-10-CM

## 2022-12-08 ENCOUNTER — Telehealth (HOSPITAL_COMMUNITY): Payer: Self-pay | Admitting: Emergency Medicine

## 2022-12-08 DIAGNOSIS — R079 Chest pain, unspecified: Secondary | ICD-10-CM

## 2022-12-08 MED ORDER — IVABRADINE HCL 5 MG PO TABS: 15.0000 mg | ORAL_TABLET | Freq: Once | ORAL | 0 refills | Status: AC

## 2022-12-08 NOTE — Telephone Encounter (Signed)
Attempted to call patient regarding upcoming cardiac CT appointment. °Left message on voicemail with name and callback number °Preslie Depasquale RN Navigator Cardiac Imaging °[facility] °[phone] Office °[phone] Cell ° °

## 2022-12-08 NOTE — Addendum Note (Signed)
Addended by: Lorenza Evangelist on: 12/08/2022 09:14 AM   Modules accepted: Orders

## 2022-12-10 ENCOUNTER — Ambulatory Visit (HOSPITAL_COMMUNITY)
Admission: RE | Admit: 2022-12-10 | Discharge: 2022-12-10 | Disposition: A | Discharge status: Home / Self Care | Payer: BC Managed Care – PPO | Source: Ambulatory Visit | Attending: Cardiology | Admitting: Cardiology

## 2022-12-10 VITALS — BP 106/62 | HR 78

## 2022-12-10 DIAGNOSIS — R0602 Shortness of breath: Secondary | ICD-10-CM | POA: Diagnosis not present

## 2022-12-10 DIAGNOSIS — R072 Precordial pain: Secondary | ICD-10-CM

## 2022-12-10 DIAGNOSIS — I25118 Atherosclerotic heart disease of native coronary artery with other forms of angina pectoris: Secondary | ICD-10-CM | POA: Insufficient documentation

## 2022-12-10 MED ORDER — NITROGLYCERIN 0.4 MG SL SUBL
SUBLINGUAL_TABLET | SUBLINGUAL | Status: AC
  Filled 2022-12-10: qty 2

## 2022-12-10 MED ORDER — NITROGLYCERIN 0.4 MG SL SUBL
0.8000 mg | SUBLINGUAL_TABLET | Freq: Once | SUBLINGUAL | Status: AC
  Administered 2022-12-10: 0.8 mg via SUBLINGUAL

## 2022-12-10 MED ORDER — IOHEXOL 350 MG/ML SOLN: 100.0000 mL | Freq: Once | INTRAVENOUS | Status: DC | PRN

## 2022-12-10 MED ORDER — IOHEXOL 350 MG/ML SOLN
100.0000 mL | Freq: Once | INTRAVENOUS | Status: AC | PRN
  Administered 2022-12-10: 100 mL via INTRAVENOUS

## 2022-12-16 ENCOUNTER — Ambulatory Visit (HOSPITAL_COMMUNITY)
Admission: RE | Admit: 2022-12-16 | Discharge: 2022-12-16 | Disposition: A | Discharge status: Home / Self Care | Payer: BC Managed Care – PPO | Source: Ambulatory Visit | Attending: Internal Medicine | Admitting: Internal Medicine

## 2022-12-16 DIAGNOSIS — R072 Precordial pain: Secondary | ICD-10-CM | POA: Diagnosis not present

## 2022-12-16 DIAGNOSIS — R931 Abnormal findings on diagnostic imaging of heart and coronary circulation: Secondary | ICD-10-CM

## 2022-12-17 ENCOUNTER — Encounter: Payer: Self-pay | Admitting: Cardiology

## 2022-12-17 DIAGNOSIS — I25118 Atherosclerotic heart disease of native coronary artery with other forms of angina pectoris: Secondary | ICD-10-CM

## 2022-12-17 DIAGNOSIS — Z5320 Procedure and treatment not carried out because of patient's decision for unspecified reasons: Secondary | ICD-10-CM

## 2023-03-10 ENCOUNTER — Ambulatory Visit: Payer: BC Managed Care – PPO | Attending: Cardiology | Admitting: Pharmacist

## 2023-03-10 DIAGNOSIS — E785 Hyperlipidemia, unspecified: Secondary | ICD-10-CM

## 2023-03-10 MED ORDER — ROSUVASTATIN CALCIUM 5 MG PO TABS: 5.0000 mg | ORAL_TABLET | Freq: Every day | ORAL | 3 refills | Status: AC

## 2023-03-10 NOTE — Progress Notes (Signed)
Patient ID: [name]                 DOB: 1960/12/31                    MRN: [id number]      HPI: [name] is a 62 y.o. female patient referred to lipid clinic by Dr. [name]. PMH is significant for elevated coronary calcium score, breat cancer, graves disease, elevated LPa.  Patient presents today to lipid clinic. We had a good discussion about LPa, the medications that can treat it (PCSK9i, Niacin, Leqvio) and the pros and cons of each and its limited data on its clinical effect of lowering LPa. We also discussed the current clinical trials for LPa medications that lower LPa significantly. Being studied mainly in secondary prevention. Cannot find record of her LPa but she knows it was high. We also discussed her hesitations in starting a statin (memory, side effects, depleting on CoQ10, risk vs benefit). We had a very open conversation about the data with statins, time to benefit, ARR (~3% in 5 years), risk of DM, muscle pains, lack of evidence for memory issues.  Discussed her risk factors for CAD and possible benefits vs risks of treatment of HLD.  Reviewed options for lowering LDL cholesterol, including ezetimibe, PCSK-9 inhibitors, bempedoic acid and inclisiran.  Discussed mechanisms of action, dosing, side effects and potential decreases in LDL cholesterol.  Also reviewed cost information and potential options for patient assistance.   Current Medications: none Intolerances: none Risk Factors: elevated LPa, CAC 127 (93%) LDL-C goal: <55 ApoB goal: <70  Diet: mostly fish, very little red meat, cottage cheese Breakfast: cottage cheese and blueberries Lunch: salad, lentil soup Dinner: fish and rice, fish and beans, vegetables, bean stew, occasionally chicken Drink sparkling water, red wine, kambutcha, coffee, sometimes protein shakes  Exercise: elliptical, weights, hike, walk -something almost everyday  Family History: Brother 2 year old MVR and CABG   Social  History: former smoker, 1 drink per night maybe 2  Labs: Lipid Panel  03/18/20: LDL-P 1557, LDL-C 156, HDL 63, TG 80  No results found for: "CHOL", "TRIG", "HDL", "CHOLHDL", "VLDL", "LDLCALC", "LDLDIRECT", "LABVLDL"  Past Medical History:  Diagnosis Date   ADD (attention deficit disorder)    Adult   Breast cancer ([place]) 2018   Left Breast Cancer   Cancer ([place])    Left Breast   Graves disease    1999   Osteopenia    Personal history of radiation therapy 2018   Left Breast Cancer   Postablative hypothyroidism    after radioactive iodine therapy    Current Outpatient Medications on File Prior to Visit  Medication Sig Dispense Refill   levothyroxine (SYNTHROID, LEVOTHROID) 75 MCG tablet Take 75 mcg by mouth daily before breakfast.     No current facility-administered medications on file prior to visit.    No Known Allergies  Assessment/Plan:  1. Hyperlipidemia -  Hyperlipidemia Assessment: Patient's CV risk factors include elevated LPa, LDL-P >1000, CAC score, family hx of premature CAD On a positive note, she is very active, eats a well balanced diet Focused on strength training We discussed decreasing alcohol intake Patient prefers not to start statin, but cannot get PCSK9i or Nexletol without trial of statin I did pull up PA form to confirm this  Plan: Patient willing to try statin Start rosuvastatin 5mg  daily I will follow up with patient in 3 months and set up labs Patient to call  me sooner if she does not tolerate. Decrease alcohol intake    Thank you,  Ramond Dial, Pharm.D, BCPS, CPP  [facility] [address] [place], [place] [address]  Phone: [phone]; Fax: [phone]

## 2023-03-10 NOTE — Patient Instructions (Signed)
Start rosuvastatin 5mg  daily I will call you in 3 months to see how you are doing If you have issues before then, please call me at [phone]

## 2023-03-10 NOTE — Assessment & Plan Note (Addendum)
Assessment: Patient's CV risk factors include elevated LPa, LDL-P >1000, CAC score, family hx of premature CAD On a positive note, she is very active, eats a well balanced diet Focused on strength training We discussed decreasing alcohol intake Patient prefers not to start statin, but cannot get PCSK9i or Nexletol without trial of statin I did pull up PA form to confirm this  Plan: Patient willing to try statin Start rosuvastatin 5mg  daily I will follow up with patient in 3 months and set up labs Patient to call me sooner if she does not tolerate. Decrease alcohol intake

## 2023-03-25 DIAGNOSIS — K621 Rectal polyp: Secondary | ICD-10-CM | POA: Diagnosis not present

## 2023-03-25 DIAGNOSIS — D128 Benign neoplasm of rectum: Secondary | ICD-10-CM | POA: Diagnosis not present

## 2023-03-25 DIAGNOSIS — K5730 Diverticulosis of large intestine without perforation or abscess without bleeding: Secondary | ICD-10-CM | POA: Diagnosis not present

## 2023-03-25 DIAGNOSIS — Z1211 Encounter for screening for malignant neoplasm of colon: Secondary | ICD-10-CM | POA: Diagnosis not present

## 2023-03-31 DIAGNOSIS — Z0000 Encounter for general adult medical examination without abnormal findings: Secondary | ICD-10-CM | POA: Diagnosis not present

## 2023-04-01 ENCOUNTER — Encounter: Payer: Self-pay | Admitting: *Deleted

## 2023-04-01 DIAGNOSIS — E213 Hyperparathyroidism, unspecified: Secondary | ICD-10-CM | POA: Diagnosis not present

## 2023-04-01 DIAGNOSIS — Z006 Encounter for examination for normal comparison and control in clinical research program: Secondary | ICD-10-CM

## 2023-04-01 DIAGNOSIS — E785 Hyperlipidemia, unspecified: Secondary | ICD-10-CM | POA: Diagnosis not present

## 2023-04-01 DIAGNOSIS — R7303 Prediabetes: Secondary | ICD-10-CM | POA: Diagnosis not present

## 2023-04-01 DIAGNOSIS — E890 Postprocedural hypothyroidism: Secondary | ICD-10-CM | POA: Diagnosis not present

## 2023-04-01 DIAGNOSIS — E7889 Other lipoprotein metabolism disorders: Secondary | ICD-10-CM | POA: Diagnosis not present

## 2023-04-01 DIAGNOSIS — M810 Age-related osteoporosis without current pathological fracture: Secondary | ICD-10-CM | POA: Diagnosis not present

## 2023-04-01 DIAGNOSIS — Z0000 Encounter for general adult medical examination without abnormal findings: Secondary | ICD-10-CM | POA: Diagnosis not present

## 2023-04-01 NOTE — Research (Signed)
Spoke to [name] about V1P states she is moving in about 4 months.

## 2023-04-21 ENCOUNTER — Telehealth: Payer: Self-pay | Admitting: Cardiology

## 2023-04-21 NOTE — Telephone Encounter (Signed)
Did you add a medication? No   If no, reason? Reason for not adding med: Other Just started Crestor with lipid clinic help . Will continue to try to intensify.   Did you remove a medication? No  Did you increase the dosage of any medication? No  Did you decrease the dosage of any medication? No  Did you refer to a specialist (i.e. lipid clinic, preventive cardiology, endocrinology)? No  Has patient seen plaque report? No

## 2023-05-17 DIAGNOSIS — M25532 Pain in left wrist: Secondary | ICD-10-CM | POA: Diagnosis not present

## 2023-05-17 DIAGNOSIS — S52502A Unspecified fracture of the lower end of left radius, initial encounter for closed fracture: Secondary | ICD-10-CM | POA: Diagnosis not present

## 2023-05-20 DIAGNOSIS — E039 Hypothyroidism, unspecified: Secondary | ICD-10-CM | POA: Diagnosis not present

## 2023-05-20 DIAGNOSIS — R946 Abnormal results of thyroid function studies: Secondary | ICD-10-CM | POA: Diagnosis not present

## 2023-05-24 DIAGNOSIS — M25532 Pain in left wrist: Secondary | ICD-10-CM | POA: Diagnosis not present

## 2023-06-02 ENCOUNTER — Telehealth: Payer: Self-pay | Admitting: Pharmacist

## 2023-06-02 DIAGNOSIS — E785 Hyperlipidemia, unspecified: Secondary | ICD-10-CM

## 2023-06-02 NOTE — Telephone Encounter (Signed)
Called patient to see how she was doing on rosuvastatin. She states that it took her about 3 weeks to convince herself to start, but that so far she is doing well. Taking 3 times a week. Will place orders for her to get labs done at [facility] at her convenience.

## 2023-06-07 DIAGNOSIS — M25532 Pain in left wrist: Secondary | ICD-10-CM | POA: Diagnosis not present

## 2023-06-21 DIAGNOSIS — M25532 Pain in left wrist: Secondary | ICD-10-CM | POA: Diagnosis not present

## 2023-06-21 DIAGNOSIS — S52502A Unspecified fracture of the lower end of left radius, initial encounter for closed fracture: Secondary | ICD-10-CM | POA: Diagnosis not present

## 2023-06-22 DIAGNOSIS — E785 Hyperlipidemia, unspecified: Secondary | ICD-10-CM | POA: Diagnosis not present

## 2023-06-23 LAB — HEPATIC FUNCTION PANEL
ALT: 13 IU/L (ref 0–32)
AST: 18 IU/L (ref 0–40)
Albumin: 4.7 g/dL (ref 3.9–4.9)
Alkaline Phosphatase: 48 IU/L (ref 44–121)
Bilirubin Total: 0.4 mg/dL (ref 0.0–1.2)
Bilirubin, Direct: 0.11 mg/dL (ref 0.00–0.40)
Total Protein: 6.7 g/dL (ref 6.0–8.5)

## 2023-06-23 LAB — LIPID PANEL
Chol/HDL Ratio: 3.1 ratio (ref 0.0–4.4)
Cholesterol, Total: 248 mg/dL — ABNORMAL HIGH (ref 100–199)
HDL: 80 mg/dL (ref >39)
LDL Chol Calc (NIH): 157 mg/dL — ABNORMAL HIGH (ref 0–99)
Triglycerides: 68 mg/dL (ref 0–149)
VLDL Cholesterol Cal: 11 mg/dL (ref 5–40)

## 2023-06-24 ENCOUNTER — Telehealth: Payer: Self-pay | Admitting: Pharmacist

## 2023-06-24 NOTE — Telephone Encounter (Signed)
Spoke with patient about lipid results. She has a lipid panel done in April before starting rosuvastatin and her LDL-C was 251. LDL-C now 157. She says she is doing alright on rosuvastatin 5mg  three times a week. Says she does feel tired. Couldn't take it daily. We talked about adding on another medication. She is not willing to take 2 medications. We talked about if we only could do 1 medication, the most effective would be PCSK9i if her insurance would cover. Other option would be nexlizet.  Patient wishes to think about it and call me back. She is moving to [place] at the end of Aug.

## 2023-07-12 DIAGNOSIS — S52502D Unspecified fracture of the lower end of left radius, subsequent encounter for closed fracture with routine healing: Secondary | ICD-10-CM | POA: Diagnosis not present

## 2023-07-12 DIAGNOSIS — M25632 Stiffness of left wrist, not elsewhere classified: Secondary | ICD-10-CM | POA: Diagnosis not present

## 2023-10-31 ENCOUNTER — Other Ambulatory Visit (HOSPITAL_BASED_OUTPATIENT_CLINIC_OR_DEPARTMENT_OTHER): Payer: Self-pay

## 2023-10-31 DIAGNOSIS — Z853 Personal history of malignant neoplasm of breast: Secondary | ICD-10-CM

## 2024-01-24 ENCOUNTER — Encounter: Payer: Self-pay | Admitting: *Deleted
